# Patient Record
Sex: Female | Born: 1995 | Race: Black or African American | Hispanic: No | Marital: Single | Smoking: Never smoker
Health system: Southern US, Community
[De-identification: ages and names within clinical notes are randomized; demographics above are authoritative.]

## PROBLEM LIST (undated history)

## (undated) DIAGNOSIS — K921 Melena: Secondary | ICD-10-CM

## (undated) HISTORY — DX: Melena: K92.1

---
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
## 2014-02-03 ENCOUNTER — Ambulatory Visit: Payer: Medicaid Other

## 2014-02-10 ENCOUNTER — Ambulatory Visit: Payer: Medicaid Other | Admitting: Family Medicine

## 2014-06-28 ENCOUNTER — Encounter: Payer: Self-pay | Admitting: Family Medicine

## 2014-06-28 ENCOUNTER — Ambulatory Visit (INDEPENDENT_AMBULATORY_CARE_PROVIDER_SITE_OTHER): Payer: Federal, State, Local not specified - PPO | Admitting: Family Medicine

## 2014-06-28 VITALS — BP 117/77 | HR 67 | Temp 98.7°F | Ht 66.0 in | Wt 124.0 lb

## 2014-06-28 DIAGNOSIS — Z3042 Encounter for surveillance of injectable contraceptive: Secondary | ICD-10-CM

## 2014-06-28 LAB — POCT URINE PREGNANCY: PREG TEST UR: NEGATIVE

## 2014-06-28 MED ORDER — MEDROXYPROGESTERONE ACETATE 150 MG/ML IM SUSP
150.0000 mg | Freq: Once | INTRAMUSCULAR | Status: AC
  Administered 2014-06-28: 150 mg via INTRAMUSCULAR

## 2014-06-28 NOTE — Progress Notes (Signed)
Patient ID: Christy Molina, female   DOB: [birth year], 18 y.o.   MRN: [id number]   [facility] [name] P[place]oua Schreiner, MD Phone: [phone]  Subjective:  Christy Molina is a 18 y.o F who presents to discuss contraception. Per report pt missed depo shot about 4 months ago and had subsequent nml periods following. LMP: 06/20/2014 ; nml in length and quantity, Previously had excessive bleeding on depo, requiring provera. Interested in restarting depo X1 month to give herself time to consider other options. Has thought abt nexplanon.   All relevant systems were reviewed and were negative unless otherwise noted in the HPI  Past Medical History Reviewed problem list.  Medications- reviewed and updated Current Outpatient Prescriptions  Medication Sig Dispense Refill  . cetirizine (ZYRTEC) 10 MG tablet Take 10 mg by mouth daily.    Marland Kitchen. docusate sodium (COLACE) 100 MG capsule Take 1 capsule (100 mg total) by mouth 2 (two) times daily. 60 capsule 1  . EPINEPHrine (EPIPEN 2-PAK) 0.3 mg/0.3 mL SOAJ injection Inject 0.3 mLs (0.3 mg total) into the muscle once. As needed for anaphylaxis 1 Device 1  . ferrous sulfate 325 (65 FE) MG tablet Take 1 tablet (325 mg total) by mouth daily with breakfast. Let's recheck iron levels in 6 months. 30 tablet 5  . medroxyPROGESTERone (PROVERA) 5 MG tablet Take 1 tablet (5 mg total) by mouth daily. 5 tablet 0  . norgestimate-ethinyl estradiol (ORTHO-CYCLEN,SPRINTEC,PREVIFEM) 0.25-35 MG-MCG tablet Take 2 tablets by mouth daily. Take for 10 days then stop for at least 2 weeks before taking again. 20 tablet 2  . polyethylene glycol powder (GLYCOLAX/MIRALAX) powder Take 17 g by mouth 2 (two) times daily as needed. To have bowel movement at least every other day 3350 g 1   No current facility-administered medications for this visit.   Chief complaint-noted No additions to family history Social history- patient is a never smoker  Objective: BP 117/77 mmHg   Pulse 67  Temp(Src) 98.7 F (37.1 C) (Oral)  Ht 5\' 6"  (1.676 m)  Wt 124 lb (56.246 kg)  BMI 20.02 kg/m2  LMP 06/21/2014 (Approximate) Gen: NAD, alert, cooperative with exam Neuro: Alert and oriented, No gross deficits Skin: no rashes no lesions  Assessment/Plan: 1. Encounter for surveillance of injectable contraceptive - POCT urine pregnancy- negative -counseled extensively regarding options -chooses depo X1 month -followed by nexplanon at next visit  -will schedule f/up at that time   [name] Matthewjames Petrasek, MD Family Medicine PGY-2

## 2014-09-15 ENCOUNTER — Ambulatory Visit: Payer: Federal, State, Local not specified - PPO | Admitting: Family Medicine

## 2014-09-17 ENCOUNTER — Ambulatory Visit (INDEPENDENT_AMBULATORY_CARE_PROVIDER_SITE_OTHER): Payer: Federal, State, Local not specified - PPO | Admitting: *Deleted

## 2014-09-17 DIAGNOSIS — Z3042 Encounter for surveillance of injectable contraceptive: Secondary | ICD-10-CM

## 2014-09-17 MED ORDER — MEDROXYPROGESTERONE ACETATE 150 MG/ML IM SUSP
150.0000 mg | Freq: Once | INTRAMUSCULAR | Status: AC
  Administered 2014-09-17: 150 mg via INTRAMUSCULAR

## 2014-09-17 NOTE — Progress Notes (Signed)
  Pt in for Depo Provera injection.  Pt tolerated Depo injection. Depo given Left upper outer quadrant.  Next injection due May 14-Dec 18, 2014.  Reminder card given. Clovis PuMartin, Tamika L, RN

## 2014-12-13 ENCOUNTER — Ambulatory Visit (INDEPENDENT_AMBULATORY_CARE_PROVIDER_SITE_OTHER): Payer: Federal, State, Local not specified - PPO | Admitting: *Deleted

## 2014-12-13 DIAGNOSIS — Z3042 Encounter for surveillance of injectable contraceptive: Secondary | ICD-10-CM | POA: Diagnosis not present

## 2014-12-13 MED ORDER — MEDROXYPROGESTERONE ACETATE 150 MG/ML IM SUSP
150.0000 mg | Freq: Once | INTRAMUSCULAR | Status: AC
  Administered 2014-12-13: 150 mg via INTRAMUSCULAR

## 2014-12-13 NOTE — Progress Notes (Signed)
   Pt in for Depo Provera injection.  Pt tolerated Depo injection. Depo given right upper outer quadrant.  Next injection due August 8-February 22, 2015.  Reminder card given. Clovis PuMartin, Jamillah Camilo L, RN

## 2015-02-07 ENCOUNTER — Encounter: Payer: Self-pay | Admitting: Internal Medicine

## 2015-02-07 ENCOUNTER — Ambulatory Visit (INDEPENDENT_AMBULATORY_CARE_PROVIDER_SITE_OTHER): Payer: Federal, State, Local not specified - PPO | Admitting: Internal Medicine

## 2015-02-07 VITALS — BP 115/81 | HR 129 | Temp 99.7°F | Ht 66.0 in | Wt 125.8 lb

## 2015-02-07 DIAGNOSIS — N3090 Cystitis, unspecified without hematuria: Secondary | ICD-10-CM | POA: Diagnosis not present

## 2015-02-07 LAB — POCT URINALYSIS DIPSTICK
BILIRUBIN UA: NEGATIVE
Glucose, UA: NEGATIVE
Ketones, UA: NEGATIVE
Nitrite, UA: NEGATIVE
Spec Grav, UA: 1.025
Urobilinogen, UA: 2
pH, UA: 6.5

## 2015-02-07 LAB — POCT UA - MICROSCOPIC ONLY

## 2015-02-07 MED ORDER — CEPHALEXIN 500 MG PO CAPS: 500.0000 mg | ORAL_CAPSULE | Freq: Two times a day (BID) | ORAL | Status: DC

## 2015-02-07 NOTE — Patient Instructions (Signed)
Thanks for coming in. You were seen for UTI. I sent a prescription for Keflex  PO BID for 7 days. Take 1 pill twice a day for 7 days. Please return to clinic if symptoms worsen.

## 2015-02-07 NOTE — Progress Notes (Signed)
Patient ID: Christy ModenaPrecious N Molina, female   DOB: 31-Jul-1995, 19 y.o.   MRN: [id number]  Subjective:   CC: Patient thinks she may have UTI  HPI:   Notes of tingling sensation at the end of urination and increased frequency of urination. Symptoms lasted for 1 week and resolved 1 week ago. Denies blood in urine, abdominal pain/back pain, vaginal discharge, fevers, chills. No prior history of similar symptoms. Has been drinking more water and cranberry juice. Otherwise no other concerns today.   Review of Systems - Per HPI. Additionally, notes of headache.   PMH reviewed     Objective:  Physical Exam BP 115/81 mmHg  Pulse 129  Temp(Src) 99.7 F (37.6 C) (Oral)  Ht 5\' 6"  (1.676 m)  Wt 125 lb 12.8 oz (57.063 kg)  BMI 20.31 kg/m2  LMP  (LMP Unknown) GEN: NAD CV: RRR, no murmurs, rubs, or gallops PULM: CTAB, normal effort ABD: Soft, nontender, nondistended, NABS, no organomegaly; no suprapubic tenderness SKIN: No rash or cyanosis; warm and well-perfused Musculoskeletal: no CVA tenderness bilaterally.  NEURO: Awake, alert, no focal deficits grossly, normal speech    Assessment:     Christy Molina is a 19 y.o. female here for possible symptoms of UTI. Patient reports symptoms resolved 1 week ago. Currently asymptomatic    Plan:    Dysuria: Notes of tingling sensation at end of urination and increase frequency of urination both of which resolved 1 week ago; Currently asymptomatic. UA dipstick revealed trace leukocytes and trace intact blood. Microscopy revealed 2+ rods, 5-10 WBC, 0-3 epithelial cells. Consistent with Cystitis.  - Keflex 500mg  PO BID for 7 days  - return precautions discussed with patient   Follow-up: PRN   Palma HolterKanishka G Gunadasa, MD PGY1 [facility]

## 2015-02-10 LAB — URINE CULTURE: Colony Count: >=100000

## 2015-02-28 ENCOUNTER — Ambulatory Visit (INDEPENDENT_AMBULATORY_CARE_PROVIDER_SITE_OTHER): Payer: Federal, State, Local not specified - PPO | Admitting: *Deleted

## 2015-02-28 DIAGNOSIS — Z3042 Encounter for surveillance of injectable contraceptive: Secondary | ICD-10-CM

## 2015-02-28 MED ORDER — MEDROXYPROGESTERONE ACETATE 150 MG/ML IM SUSP
150.0000 mg | Freq: Once | INTRAMUSCULAR | Status: AC
  Administered 2015-02-28: 150 mg via INTRAMUSCULAR

## 2015-02-28 NOTE — Progress Notes (Signed)
   Pt in for Depo Provera injection.  Pt tolerated Depo injection. Depo given left upper outer quadrant.  Next injection due Oct 24-Nov. 7, 2016.  Reminder card given. Clovis Pu, RN

## 2015-05-16 ENCOUNTER — Ambulatory Visit: Payer: Federal, State, Local not specified - PPO

## 2015-05-17 ENCOUNTER — Ambulatory Visit (INDEPENDENT_AMBULATORY_CARE_PROVIDER_SITE_OTHER): Payer: Federal, State, Local not specified - PPO | Admitting: *Deleted

## 2015-05-17 DIAGNOSIS — Z3042 Encounter for surveillance of injectable contraceptive: Secondary | ICD-10-CM

## 2015-05-17 MED ORDER — MEDROXYPROGESTERONE ACETATE 150 MG/ML IM SUSP
150.0000 mg | Freq: Once | INTRAMUSCULAR | Status: AC
  Administered 2015-05-17: 150 mg via INTRAMUSCULAR

## 2015-05-17 NOTE — Progress Notes (Signed)
   Pt in for Depo Provera injection.  Pt tolerated Depo injection. Depo given right upper outer quadrant.  Next injection due Jan. 10-Jan. 24, 2017.  Reminder card given. Clovis PuMartin, Tamika L, RN

## 2015-07-29 ENCOUNTER — Ambulatory Visit (INDEPENDENT_AMBULATORY_CARE_PROVIDER_SITE_OTHER): Payer: Federal, State, Local not specified - PPO | Admitting: Family Medicine

## 2015-07-29 ENCOUNTER — Other Ambulatory Visit (HOSPITAL_COMMUNITY)
Admission: RE | Admit: 2015-07-29 | Discharge: 2015-07-29 | Disposition: A | Discharge status: Home / Self Care | Payer: Federal, State, Local not specified - PPO | Source: Ambulatory Visit | Attending: Family Medicine | Admitting: Family Medicine

## 2015-07-29 ENCOUNTER — Encounter: Payer: Self-pay | Admitting: Family Medicine

## 2015-07-29 VITALS — BP 104/61 | HR 86 | Temp 98.3°F | Ht 66.0 in | Wt 128.5 lb

## 2015-07-29 DIAGNOSIS — Z113 Encounter for screening for infections with a predominantly sexual mode of transmission: Secondary | ICD-10-CM | POA: Insufficient documentation

## 2015-07-29 DIAGNOSIS — N898 Other specified noninflammatory disorders of vagina: Secondary | ICD-10-CM | POA: Diagnosis not present

## 2015-07-29 DIAGNOSIS — Z3042 Encounter for surveillance of injectable contraceptive: Secondary | ICD-10-CM

## 2015-07-29 LAB — POCT WET PREP (WET MOUNT): Clue Cells Wet Prep Whiff POC: NEGATIVE

## 2015-07-29 MED ORDER — MEDROXYPROGESTERONE ACETATE 150 MG/ML IM SUSP
150.0000 mg | Freq: Once | INTRAMUSCULAR | Status: AC
  Administered 2015-07-29: 150 mg via INTRAMUSCULAR

## 2015-07-29 NOTE — Patient Instructions (Signed)
Thank you so much for coming to visit today! We obtained a lab today for your discharge and you will be contacted with the results. I will let you know if a medication needs to be sent in depending on the results.   Thanks again! Dr. Caroleen Hammanumley

## 2015-07-30 DIAGNOSIS — N898 Other specified noninflammatory disorders of vagina: Secondary | ICD-10-CM | POA: Insufficient documentation

## 2015-07-30 NOTE — Progress Notes (Signed)
Subjective:     Patient ID: Christy Molina, female   DOB: 1996/06/13, 20 y.o.   MRN: [id number]  HPI Christy Molina is a [place]19yo female presenting today for vaginal discharge. - Discharge white - Started three weeks ago, but seems to be getting better - Notes genital sore one week ago, resolved - Sexually active. Currently on Depo for birth control. Uses condoms. - Denies abdominal pain, abnormal vaginal bleeding, fever  Review of Systems Per HPI. Otherwise negative.    Objective:   Physical Exam  Constitutional: She appears well-developed and well-nourished. No distress.  Cardiovascular: Normal rate.  Exam reveals no gallop and no friction rub.   No murmur heard. Pulmonary/Chest: Effort normal. No respiratory distress. She has no wheezes.  Genitourinary:  No discharge noted. Hyperpigmented lesion on vulva, but no open sore or ulcer.      Assessment and Plan:     Vaginal discharge - Wet prep negative - Gonorrhea/Chlamydia pending - Consider HSV, HIV, and RPR if no improvement or further symptoms occur - Follow up if no improvement

## 2015-07-30 NOTE — Assessment & Plan Note (Signed)
-   Wet prep negative - Gonorrhea/Chlamydia pending - Consider HSV, HIV, and RPR if no improvement or further symptoms occur - Follow up if no improvement

## 2015-08-01 LAB — CERVICOVAGINAL ANCILLARY ONLY
CHLAMYDIA, DNA PROBE: NEGATIVE
NEISSERIA GONORRHEA: NEGATIVE

## 2015-10-06 IMAGING — US US PELVIS COMPLETE
1 series · 14 of 25 positions shown · non-contrast
Comparison: None.

CLINICAL DATA: Menorrhagia

EXAM:
TRANSABDOMINAL ULTRASOUND OF PELVIS
TECHNIQUE: Transabdominal ultrasound examination of the pelvis was performed
including evaluation of the uterus, ovaries, adnexal regions, and
pelvic cul-de-sac.

[Series 1: us pelvis complete · 14 of 28 slices shown]
[im 1/28]
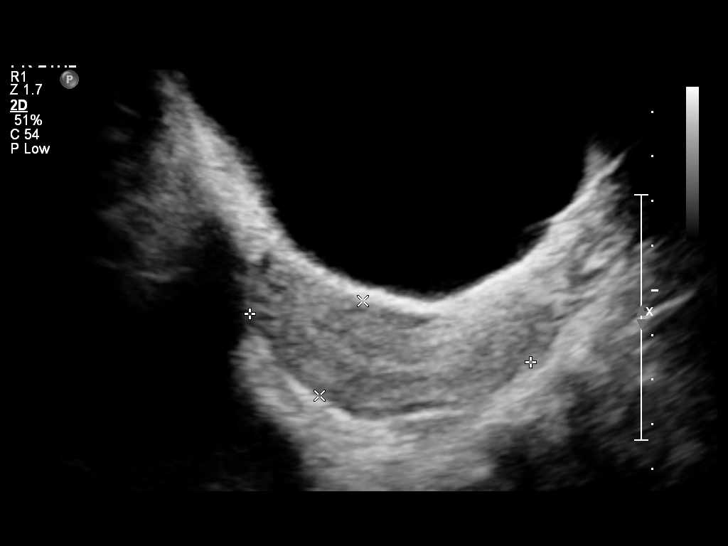
[im 3/28]
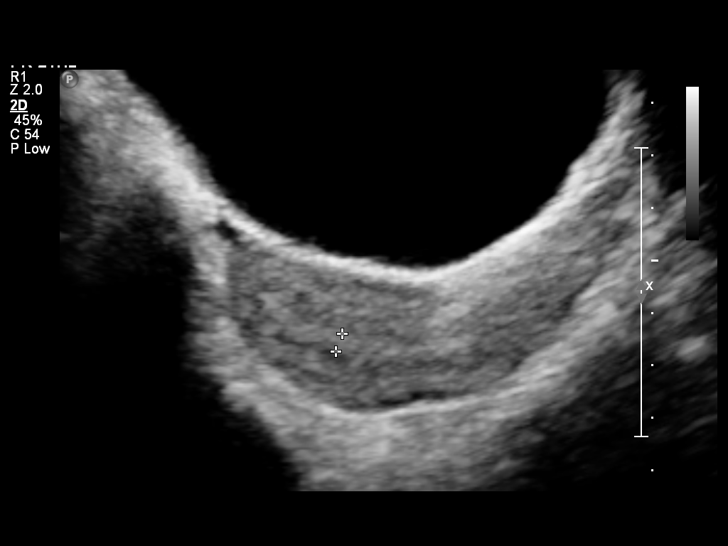
[im 5/28]
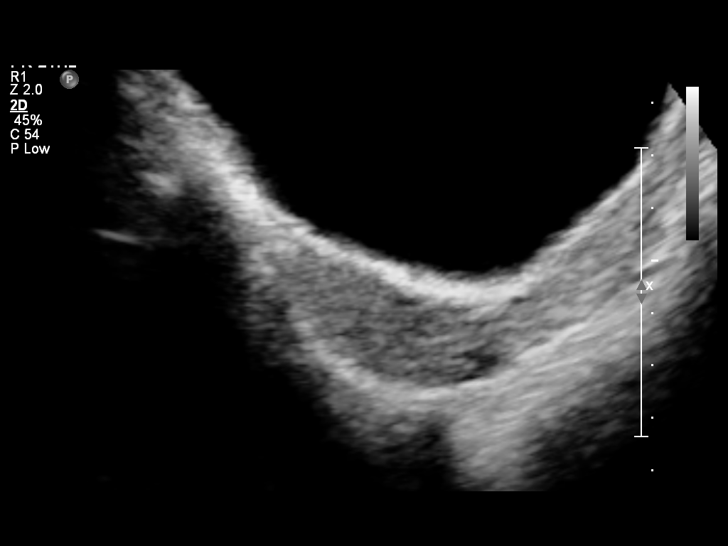
[im 7/28]
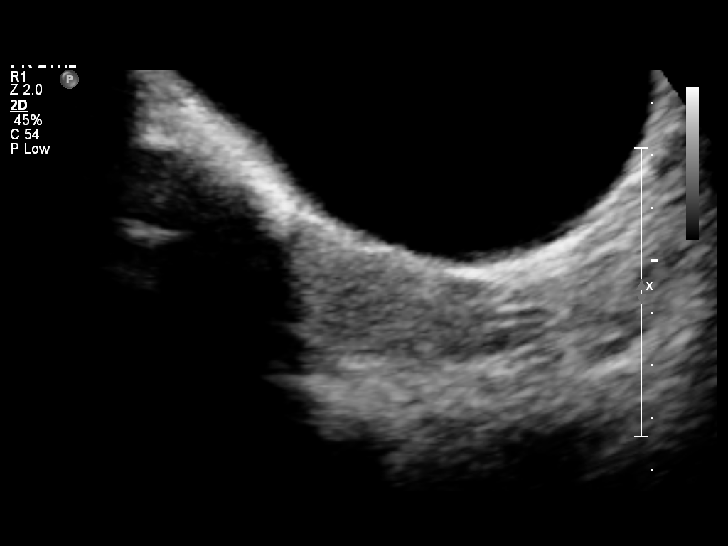
[im 10/28]
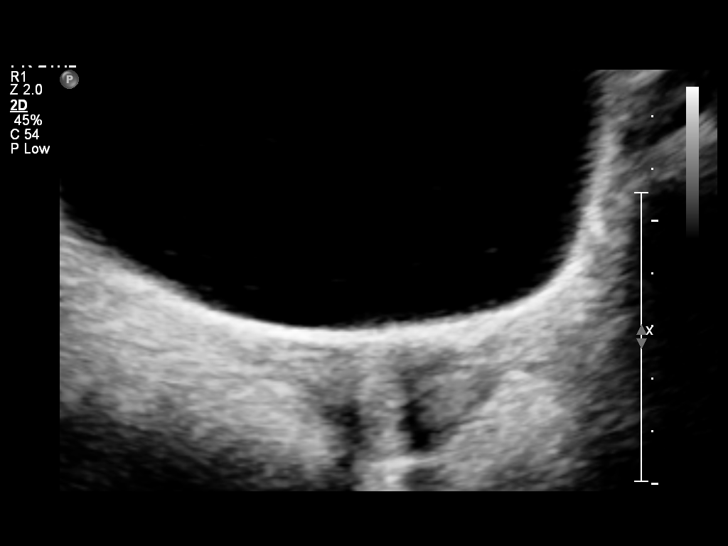
[im 11/28]
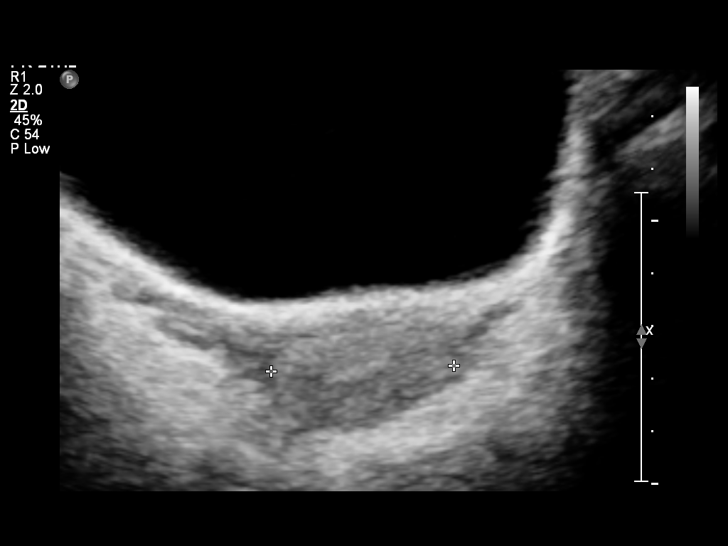
[im 13/28]
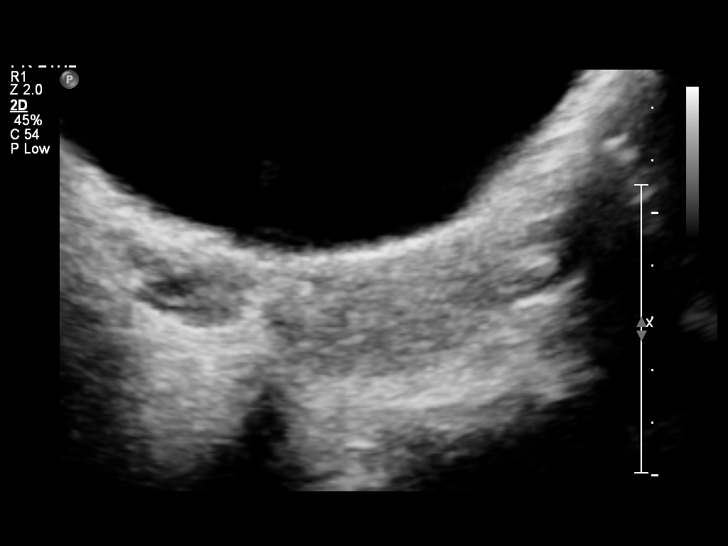
[im 15/28]
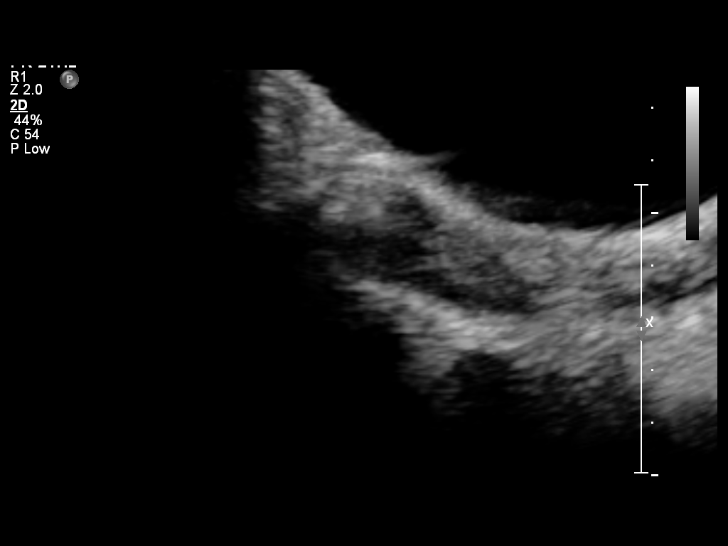
[im 17/28]
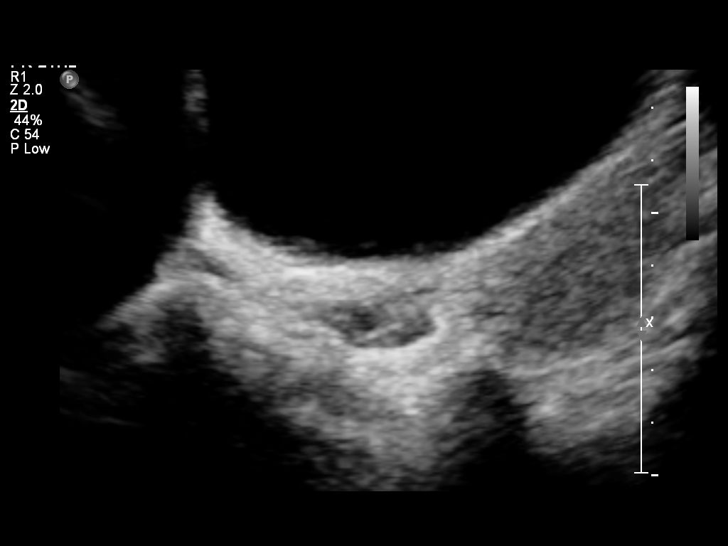
[im 19/28]
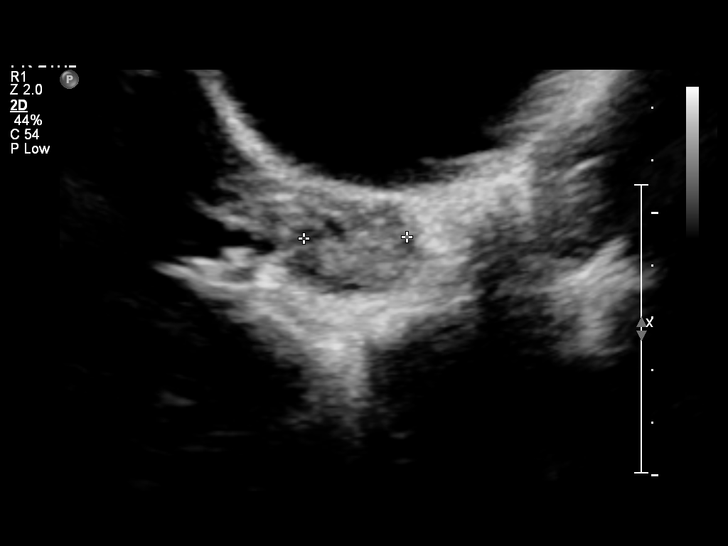
[im 21/28]
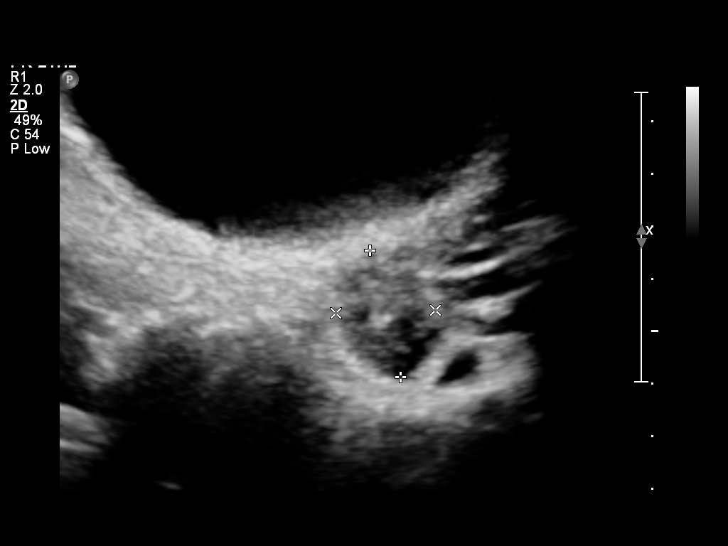
[im 23/28]
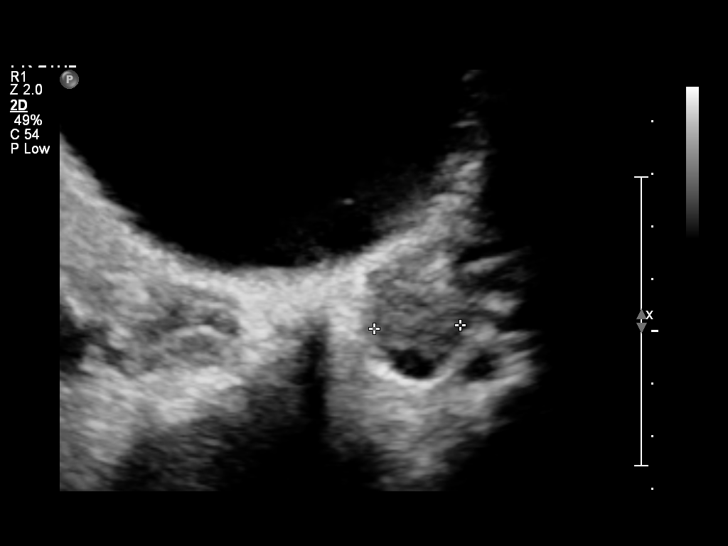
[im 25/28]
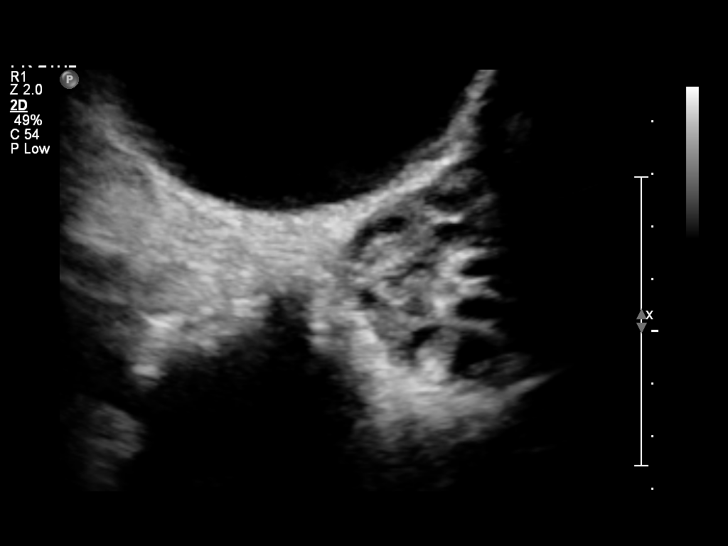
[im 28/28]
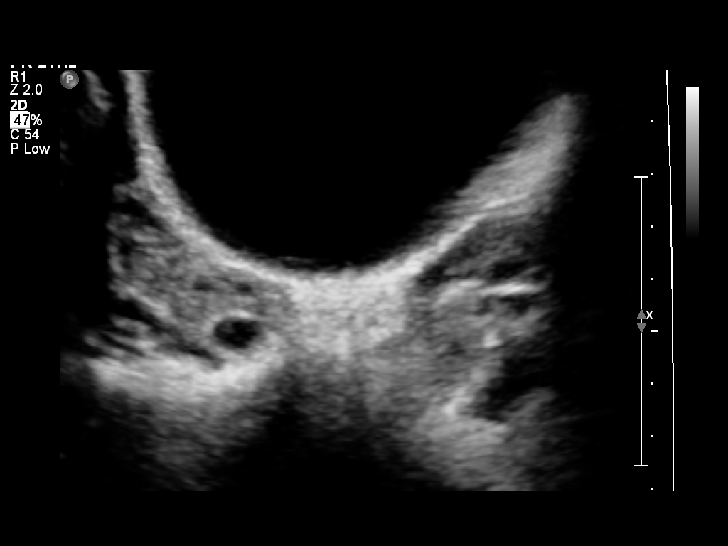

[14 of 25 positions shown; findings below may reference images not displayed]

FINDINGS: Uterus

Measurements: 6.4 x 2.3 x 3.5 cm. No fibroids or other mass
visualized.

Endometrium

Thickness: 4 mm.  No focal abnormality visualized.

Right ovary

Measurements: 4.0 x 1.4 x 2.0 cm. Normal appearance/no adnexal mass.

Left ovary

Measurements: 2.5 x 1.9 x 1.6 cm. Normal appearance/no adnexal mass.

Other findings:  No free fluid
IMPRESSION: Unremarkable transabdominal pelvic ultrasound.

## 2015-10-14 ENCOUNTER — Ambulatory Visit: Payer: Federal, State, Local not specified - PPO

## 2015-10-17 ENCOUNTER — Ambulatory Visit: Payer: Federal, State, Local not specified - PPO

## 2016-08-22 ENCOUNTER — Ambulatory Visit: Payer: Managed Care, Other (non HMO) | Admitting: Family Medicine

## 2016-08-23 ENCOUNTER — Ambulatory Visit: Payer: Federal, State, Local not specified - PPO | Admitting: Internal Medicine

## 2016-12-20 NOTE — Progress Notes (Signed)
Christy Molina is a 21 y.o. female here for to Establish Care and discuss Amenorrhea.  I acted as a Neurosurgeon for [employer], PA-C Christy Mull, LPN  History of Present Illness:   Chief Complaint  Patient presents with  . Establish Care    [employer]   . Amenorrhea    Last period was 08/2015, light flow x 4 days, spotting only in 04/2016  . pelvic cramps and low back pain    Acute Concerns: Amenorrhea -- Patient reports that she has a long history of abnormal periods. She states that she was put on Depo-Provera for approximately 4 years and most recently had her last injection January 2017. She discontinued Depo-Provera to take a break from it. Since January 2017 she has had one period which was in February 2017 followed by one additional episode of light spotting in October 2017. She does get cramps every month as well as back pain but does not have any vaginal bleeding. She states that prior to being on Depo-Provera she had very irregular periods. She states that prior to going on Depo-Provera, she had a period that lasted 6 months. In 2015 she had a pelvic abdominal ultrasound for menorrhagia that was unremarkable. Was also given Provera 5 mg x 5 days in May 2015. Per chart review in 2013, patient had some workup for possible PCOS from her primary care provider in family medicine. She had low Hgb secondary to heavy bleeding and a moderately elevated testosterone and she was not diagnosed with PCOS at that time. She reports that she does not have any excessive hair growth, or family history of PCOS. She does report that her mom has an ovarian cyst as well as type 2 diabetes. She is currently sexually active, and has one partner, denies any concerns for sexually transmitted infections. Denies any history of sexually transmitted infections. She states that she has had one Pap smear and this was at age 29 and from what she can remember was normal. She has never been pregnant. She does report that  she is currently at her highest weight that she has ever been 139 pounds. Prior to starting Depo-Provera she was 125 pounds. She takes Midol for her abdominal cramping during her monthly cramping however does not help. She denies any bleeding with or after sex or pain with sex.  Depo-Provera: Nov 2013 - Jan 2017  Menarche: age 81 or 81  Chronic Issues: None  Health Maintenance: Weight -- Weight: 139 lb (63 kg)   Depression screen PHQ 2/9 12/21/2016  Decreased Interest 0  Down, Depressed, Hopeless 0  PHQ - 2 Score 0    Other providers/specialists: Never seen an Ob-Gyn  PMHx, SurgHx, SocialHx, Medications, and Allergies were reviewed in the Visit Navigator and updated as appropriate.  Current Medications:   Current Outpatient Prescriptions:  .  cetirizine (ZYRTEC) 10 MG tablet, Take 10 mg by mouth daily., Disp: , Rfl:  .  EPINEPHrine (EPIPEN 2-PAK) 0.3 mg/0.3 mL SOAJ injection, Inject 0.3 mLs (0.3 mg total) into the muscle once. As needed for anaphylaxis (Patient not taking: Reported on 12/21/2016), Disp: 1 Device, Rfl: 1 .  ferrous sulfate 325 (65 FE) MG tablet, Take 1 tablet (325 mg total) by mouth daily with breakfast. Let's recheck iron levels in 6 months. (Patient not taking: Reported on 12/21/2016), Disp: 30 tablet, Rfl: 5   Review of Systems:   Review of Systems  Constitutional: Negative for chills, fever, malaise/fatigue and weight loss.  HENT: Negative for  hearing loss, sinus pain and sore throat.   Eyes: Negative for blurred vision.  Respiratory: Negative for cough and shortness of breath.   Cardiovascular: Negative for chest pain, palpitations and leg swelling.  Gastrointestinal: Negative for abdominal pain, constipation, diarrhea, heartburn, nausea and vomiting.  Genitourinary: Negative for dysuria, frequency and urgency.  Musculoskeletal: Positive for back pain and neck pain. Negative for myalgias.       Low back pain and pelvic cramps off and on  Skin: Negative for  itching and rash.  Neurological: Positive for headaches. Negative for dizziness, tingling, seizures and loss of consciousness.  Endo/Heme/Allergies: Negative for polydipsia.  Psychiatric/Behavioral: Negative for depression. The patient is not nervous/anxious.     Vitals:   Vitals:   12/21/16 0845  BP: 110/74  Pulse: 84  Temp: 99.1 F (37.3 C)  TempSrc: Oral  SpO2: 99%  Weight: 139 lb (63 kg)  Height: 5\' 6"  (1.676 m)     Body mass index is 22.44 kg/m.  Physical Exam:   Physical Exam  Constitutional: She appears well-developed. She is cooperative.  Non-toxic appearance. She does not have a sickly appearance. She does not appear ill. No distress.  Cardiovascular: Normal rate, regular rhythm, S1 normal, S2 normal, normal heart sounds and normal pulses.   No LE edema  Pulmonary/Chest: Effort normal and breath sounds normal.  Abdominal: Normal appearance and bowel sounds are normal. There is tenderness in the left lower quadrant. There is no rigidity, no rebound and no guarding.  Genitourinary: Vagina normal. There is no rash, tenderness or lesion on the right labia. There is no rash, tenderness or lesion on the left labia. Cervix exhibits no friability.  Genitourinary Comments: Leukorrhea present, no odor. Patient very nervous throughout exam. I did not complete a bimanual exam. Pap obtained.  Neurological: She is alert.  Nursing note and vitals reviewed.     Assessment and Plan:    Christy Molina was seen today for establish care, amenorrhea and pelvic cramps and low back pain.  Diagnoses and all orders for this visit:  Amenorrhea Patient has had extensive workup in the past for family medicine without any findings. Given extensive ongoing issues, we will refer her to OB/GYN. I have obtained some baseline labs today including CBC, CMP, TSH, Pap with cytology. Urine pregnancy was negative today. I advised patient to continue to use condoms for birth control and to help prevent STI's  until further evaluated by OB/GYN to discuss further options for birth control. -     POCT urine pregnancy -     Cytology - PAP -     CBC with Differential/Platelet -     Comprehensive metabolic panel -     TSH -     Ambulatory referral to Obstetrics / Gynecology  Vaginal discharge I have ordered Pap as well as cytology to screen for BV, yeast, STI's. We will call her with these results. -     Cytology - PAP  Encounter for screening for HIV She is agreeable to one-time HIV screening. -     HIV antibody  . Reviewed expectations re: course of current medical issues. . Discussed self-management of symptoms. . Outlined signs and symptoms indicating need for more acute intervention. . Patient verbalized understanding and all questions were answered. . See orders for this visit as documented in the electronic medical record. . Patient received an After-Visit Summary.  CMA or LPN served as scribe during this visit. History, Physical, and Plan performed by medical provider. Documentation  and orders reviewed and attested to.  Jarold MottoSamantha Goldie Tregoning, PA-C

## 2016-12-21 ENCOUNTER — Encounter: Payer: Self-pay | Admitting: Physician Assistant

## 2016-12-21 ENCOUNTER — Ambulatory Visit (INDEPENDENT_AMBULATORY_CARE_PROVIDER_SITE_OTHER): Payer: Managed Care, Other (non HMO) | Admitting: Physician Assistant

## 2016-12-21 ENCOUNTER — Other Ambulatory Visit (HOSPITAL_COMMUNITY)
Admission: RE | Admit: 2016-12-21 | Discharge: 2016-12-21 | Disposition: A | Discharge status: Home / Self Care | Payer: Managed Care, Other (non HMO) | Source: Ambulatory Visit | Attending: Family Medicine | Admitting: Family Medicine

## 2016-12-21 VITALS — BP 110/74 | HR 84 | Temp 99.1°F | Ht 66.0 in | Wt 139.0 lb

## 2016-12-21 DIAGNOSIS — Z114 Encounter for screening for human immunodeficiency virus [HIV]: Secondary | ICD-10-CM | POA: Diagnosis not present

## 2016-12-21 DIAGNOSIS — N898 Other specified noninflammatory disorders of vagina: Secondary | ICD-10-CM

## 2016-12-21 DIAGNOSIS — N912 Amenorrhea, unspecified: Secondary | ICD-10-CM | POA: Insufficient documentation

## 2016-12-21 LAB — COMPREHENSIVE METABOLIC PANEL
ALBUMIN: 4.6 g/dL (ref 3.5–5.2)
ALK PHOS: 43 U/L (ref 39–117)
ALT: 6 U/L (ref 0–35)
AST: 16 U/L (ref 0–37)
BUN: 8 mg/dL (ref 6–23)
CALCIUM: 9.7 mg/dL (ref 8.4–10.5)
CO2: 28 mEq/L (ref 19–32)
CREATININE: 0.79 mg/dL (ref 0.40–1.20)
Chloride: 104 mEq/L (ref 96–112)
GFR: 117.55 mL/min (ref >60.00)
Glucose, Bld: 82 mg/dL (ref 70–99)
Potassium: 3.6 mEq/L (ref 3.5–5.1)
Sodium: 138 mEq/L (ref 135–145)
TOTAL PROTEIN: 7.2 g/dL (ref 6.0–8.3)
Total Bilirubin: 1.5 mg/dL — ABNORMAL HIGH (ref 0.2–1.2)

## 2016-12-21 LAB — CBC WITH DIFFERENTIAL/PLATELET
BASOS PCT: 0.7 % (ref 0.0–3.0)
Basophils Absolute: 0 10*3/uL (ref 0.0–0.1)
EOS ABS: 0.1 10*3/uL (ref 0.0–0.7)
Eosinophils Relative: 1.8 % (ref 0.0–5.0)
HEMATOCRIT: 41.7 % (ref 36.0–46.0)
HEMOGLOBIN: 14.1 g/dL (ref 12.0–15.0)
LYMPHS PCT: 37.7 % (ref 12.0–46.0)
Lymphs Abs: 2 10*3/uL (ref 0.7–4.0)
MCHC: 33.9 g/dL (ref 30.0–36.0)
MCV: 90.7 fl (ref 78.0–100.0)
Monocytes Absolute: 0.4 10*3/uL (ref 0.1–1.0)
Monocytes Relative: 6.8 % (ref 3.0–12.0)
Neutro Abs: 2.8 10*3/uL (ref 1.4–7.7)
Neutrophils Relative %: 53 % (ref 43.0–77.0)
Platelets: 198 10*3/uL (ref 150.0–400.0)
RBC: 4.6 Mil/uL (ref 3.87–5.11)
RDW: 13.6 % (ref 11.5–15.5)
WBC: 5.3 10*3/uL (ref 4.0–10.5)

## 2016-12-21 LAB — TSH: TSH: 1.44 u[IU]/mL (ref 0.35–4.50)

## 2016-12-21 LAB — POCT URINE PREGNANCY: PREG TEST UR: NEGATIVE

## 2016-12-21 NOTE — Patient Instructions (Signed)
It was great meeting you today!  We will call you with your lab results.  I am going to refer to a gynecologist for further work-up for your symptoms.  Primary Amenorrhea Primary amenorrhea is the absence of any menstrual flow in a female by the age of 15 years. An average age for the start of menstruation is the age of 12 years. Primary amenorrhea is not considered to have occurred until a female is older than 15 years and has never menstruated. This may occur with or without other signs of puberty. What are the causes? Some common causes of not menstruating include:  Chromosomal abnormality causing the ovaries to malfunction is the most common cause of primary amenorrhea.  Malnutrition.  Low blood sugar (hypoglycemia).  Polycystic ovary syndrome (cysts in the ovaries, not ovulating).  Absence of the vagina, uterus, or ovaries since birth (congenital).  Extreme obesity.  Cystic fibrosis.  Drastic weight loss from any cause.  Over-exercising (running, biking) causing loss of body fat.  Pituitary gland tumor in the brain.  Long-term (chronic) illnesses.  Cushing disease.  Thyroid disease (hypothyroidism, hyperthyroidism).  Part of the brain (hypothalamus) not functioning normally.  Premature ovarian failure.  What are the signs or symptoms? No menstruation by age 22 years in normally developed females is the primary symptom. Other symptoms may include:  Discharge from the breasts.  Hot flashes.  Adult acne.  Facial or chest hair.  Headaches.  Impaired vision.  Recent stress.  Changes in weight, diet, or exercise patterns.  How is this diagnosed? Primary amenorrhea is diagnosed with the help of a medical history and a physical exam. Other tests that may be recommended include:  Blood tests to check for pregnancy, hormonal changes, a bleeding or thyroid disorder, low iron levels (anemia), or other problems.  Urine tests.  Specialized X-ray exams.  How  is this treated? Treatment will depend on the cause. For example, some of the causes of primary amenorrhea, such as congenital absence of sex organs, will require surgery to correct. Others may respond to treatment with medicine. Contact a health care provider if:  There has not been any menstrual flow by age 22 years.  Body maturation does not occur at a level typical of peers.  Pelvic area pain occurs.  There is unusual weight gain or hair growth. This information is not intended to replace advice given to you by your health care provider. Make sure you discuss any questions you have with your health care provider. Document Released: 07/09/2005 Document Revised: 12/15/2015 Document Reviewed: 02/18/2013 Elsevier Interactive Patient Education  2018 [employer]

## 2016-12-22 LAB — HIV ANTIBODY (ROUTINE TESTING W REFLEX): HIV: NONREACTIVE

## 2016-12-25 LAB — CYTOLOGY - PAP
Bacterial vaginitis: NEGATIVE
Candida vaginitis: NEGATIVE
Chlamydia: NEGATIVE
Diagnosis: NEGATIVE
Neisseria Gonorrhea: NEGATIVE
TRICH (WINDOWPATH): NEGATIVE

## 2016-12-27 ENCOUNTER — Ambulatory Visit (INDEPENDENT_AMBULATORY_CARE_PROVIDER_SITE_OTHER): Payer: Managed Care, Other (non HMO) | Admitting: Obstetrics & Gynecology

## 2016-12-27 ENCOUNTER — Encounter: Payer: Self-pay | Admitting: Obstetrics & Gynecology

## 2016-12-27 VITALS — BP 118/76 | Ht 65.0 in | Wt 140.0 lb

## 2016-12-27 DIAGNOSIS — Z3009 Encounter for other general counseling and advice on contraception: Secondary | ICD-10-CM | POA: Diagnosis not present

## 2016-12-27 DIAGNOSIS — N911 Secondary amenorrhea: Secondary | ICD-10-CM | POA: Diagnosis not present

## 2016-12-27 LAB — PREGNANCY, URINE: Preg Test, Ur: NEGATIVE

## 2016-12-27 MED ORDER — MEDROXYPROGESTERONE ACETATE 5 MG PO TABS: 5.0000 mg | ORAL_TABLET | Freq: Every day | ORAL | 0 refills | Status: DC

## 2016-12-27 NOTE — Patient Instructions (Signed)
1. Secondary amenorrhea Last DepoProvera 07/2015 which could have caused her Amenorrhea for up to a year.  Still no period x 5 additional months.  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  Labs so far wnl, will complete with Prolactin today.  Provera 5 mg PO qd x 7 days to bring a withdrawal bleeding.  Will then proceed with a Pelvic US and decide on treatment with all the investigation completed. - Prolactin - US Transvaginal Non-OB; Future  2. Encounter for other general counseling or advice on contraception Strict condom use.  Declines starting back on DepoProvera and not recommended.  Declines BCPs, worried about compliance.  Patch, Ring reviewed.  Nexplanon and Progestin IUDs Benefits/Risks/Insertion reviewed in details and pamphlets given.  Will probably opt for Mirena IUD, will confirm at f/u.   Christy Molina, it was a pleasure to meet you today!  I will inform you of your result as soon as available.  See you in 2 wks for the pelvic US and decide on treatment and contraception.

## 2016-12-27 NOTE — Progress Notes (Signed)
    Christy Molina April 24, 1996 [id number]        21 y.o.  G0 Boyfriend/Condoms.  Menarche 10-21 yo  RP:  Amenorrhea x more than a year  HPI:  Long history of irregular and heavy periods.  Started on Depo-Provera which she took for 4 years (05/2012 to 07/2015), last injection January 2017. Since then, had a period in February 2017, followed by one additional episode of light spotting in October 2017.  No vaginal bleeding since, but midpelvic cramps every month with back pain.  No pelvic pain otherwise.  Normal vaginal secretions.  Strict condom use.  Acne on chest mainly, facial acne is mild.  No increase hair in female distribution.  No H/O STI.  BMI 23.3.  Past medical history,surgical history, problem list, medications, allergies, family history and social history were all reviewed and documented in the EPIC chart.  Directed ROS with pertinent positives and negatives documented in the history of present illness/assessment and plan.  Exam:  Vitals:   12/27/16 1523  BP: 118/76  Weight: 140 lb (63.5 kg)  Height: 5\' 5"  (1.651 m)   General appearance:  Normal  Abdo:  Soft, not distended, no mass, NT. Gyn exam:  Just done with Pap normal.  Gono-Chlam neg.                       Will complete evaluation with a Pelvic US.  Labs 12/2016:  TSH 1.44 wnl, Glu 82 wnl.  CMP wnl except mild increase of Tot Bili.  CBC wnl.                         Pap, Gono-Chlam neg.  UPT neg. UPT neg today again.  Assessment/Plan:  21 y.o. G0  1. Secondary amenorrhea Last DepoProvera 07/2015 which could have caused her Amenorrhea for up to a year.  Still no period x 5 additional months.  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  Labs so far wnl, will complete with Prolactin today.  Provera 5 mg PO qd x 7 days to bring a withdrawal bleeding.  Will then proceed with a Pelvic US and decide on treatment with all the investigation completed. - Prolactin - US Transvaginal Non-OB; Future  2. Encounter for other general  counseling or advice on contraception Strict condom use.  Declines starting back on DepoProvera and not recommended.  Declines BCPs, worried about compliance.  Patch, Ring reviewed.  Nexplanon and Progestin IUDs Benefits/Risks/Insertion reviewed in details and pamphlets given.  Will probably opt for Mirena IUD, will confirm at f/u.  Counseling on above issues >50% x 30 minutes.  Genia DelMarie-Lyne Lumi Winslett MD, 3:40 PM 12/27/2016

## 2016-12-28 LAB — PROLACTIN: PROLACTIN: 19.9 ng/mL

## 2017-01-07 ENCOUNTER — Ambulatory Visit: Payer: Managed Care, Other (non HMO) | Admitting: Obstetrics & Gynecology

## 2017-01-07 ENCOUNTER — Other Ambulatory Visit: Payer: Managed Care, Other (non HMO)

## 2017-01-07 DIAGNOSIS — Z0289 Encounter for other administrative examinations: Secondary | ICD-10-CM

## 2017-09-30 ENCOUNTER — Encounter: Payer: Self-pay | Admitting: Physician Assistant

## 2017-09-30 ENCOUNTER — Ambulatory Visit (INDEPENDENT_AMBULATORY_CARE_PROVIDER_SITE_OTHER): Payer: 59 | Admitting: Physician Assistant

## 2017-09-30 VITALS — BP 118/80 | HR 89 | Temp 98.5°F | Ht 65.0 in | Wt 159.4 lb

## 2017-09-30 DIAGNOSIS — H1033 Unspecified acute conjunctivitis, bilateral: Secondary | ICD-10-CM | POA: Diagnosis not present

## 2017-09-30 MED ORDER — POLYMYXIN B-TRIMETHOPRIM 10000-0.1 UNIT/ML-% OP SOLN: 1.0000 [drp] | OPHTHALMIC | 0 refills | Status: DC

## 2017-09-30 NOTE — Patient Instructions (Addendum)
I have sent in eye drops for you to use.  If symptoms worsen or persist despite treatment, I recommend that you go to an eye doctor. If you develop severe pain or changes in vision, please go to the ER.

## 2017-09-30 NOTE — Progress Notes (Signed)
Nyriah N Benjiman CoreHinton is a 22 y.o. female here for a new problem.  I acted as a Neurosurgeonscribe for [employer] CorporationSamantha Chayim Bialas, PA-C Corky Mullonna Orphanos, LPN  History of Present Illness:   Chief Complaint  Patient presents with  . Eye Problem    Eye Problem   Both eyes are affected.This is a new problem. Episode onset: Started on Friday. The problem occurs constantly. The problem has been gradually worsening. There was no injury mechanism. The pain is at a severity of 8/10. The pain is moderate. There is no known exposure to pink eye. She does not wear contacts. Associated symptoms include blurred vision, an eye discharge, eye redness, itching and photophobia. Pertinent negatives include no fever, nausea, recent URI or vomiting. She has tried eye drops for the symptoms. The treatment provided no relief.   She has been using erythromycin ointment that her mom had, she thinks that this may have made her eyes worse -- she does not know if this is expired. She does not wear contacts. 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 She also reports that they got new carpet installed in her home last week.  She reports that the symptoms started in one eye and then transferred to the other eye.  Past Medical History:  Diagnosis Date  . Hematochezia      Social History   Socioeconomic History  . Marital status: Single    Spouse name: Not on file  . Number of children: Not on file  . Years of education: Not on file  . Highest education level: Not on file  Social Needs  . Financial resource strain: Not on file  . Food insecurity - worry: Not on file  . Food insecurity - inability: Not on file  . Transportation needs - medical: Not on file  . Transportation needs - non-medical: Not on file  Occupational History  . Not on file  Tobacco Use  . Smoking status: Never Smoker  . Smokeless tobacco: Never Used  Substance and Sexual Activity  . Alcohol use: Yes   Comment: one glass of wine occassionally  . Drug use: No  . Sexual activity: Yes    Birth control/protection: Condom  Other Topics Concern  . Not on file  Social History Narrative  . Not on file    Past Surgical History:  Procedure Laterality Date  . WISDOM TOOTH EXTRACTION Bilateral 2014    Family History  Problem Relation Age of Onset  . Diabetes Mother     Allergies  Allergen Reactions  . Food     Citrus fruits  . Peanut-Containing Drug Products     Current Medications:   Current Outpatient Medications:  .  cetirizine (ZYRTEC) 10 MG tablet, Take 10 mg by mouth daily., Disp: , Rfl:  .  trimethoprim-polymyxin b (POLYTRIM) ophthalmic solution, Place 1 drop into both eyes every 4 (four) hours., Disp: 10 mL, Rfl: 0   Review of Systems:   Review of Systems  Constitutional: Negative for fever.  Eyes: Positive for blurred vision, photophobia, discharge, redness and itching.  Gastrointestinal: Negative for nausea and vomiting.    Vitals:   Vitals:   09/30/17 0808  BP: 118/80  Pulse: 89  Temp: 98.5 F (36.9 C)  TempSrc: Oral  SpO2: 100%  Weight: 159 lb 6.1 oz (72.3 kg)  Height: 5\' 5"  (1.651 m)     Body mass index is 26.52 kg/m.  Physical Exam:  Physical Exam  Constitutional: She appears well-developed. She is cooperative.  Non-toxic appearance. She does not have a sickly appearance. She does not appear ill. No distress.  HENT:  Head: Normocephalic and atraumatic.  Right Ear: Tympanic membrane, external ear and ear canal normal. Tympanic membrane is not erythematous, not retracted and not bulging.  Left Ear: Tympanic membrane, external ear and ear canal normal. Tympanic membrane is not erythematous, not retracted and not bulging.  Nose: Nose normal. Right sinus exhibits no maxillary sinus tenderness and no frontal sinus tenderness. Left sinus exhibits no maxillary sinus tenderness and no frontal sinus tenderness.  Mouth/Throat: Uvula is midline. No posterior  oropharyngeal edema or posterior oropharyngeal erythema.  Eyes: EOM and lids are normal. Pupils are equal, round, and reactive to light. Right conjunctiva is injected. Left conjunctiva is injected.  Bilateral eyes with erythema and clear discharge. No evidence of purulent discharge or crusting. No pain elicited with EOM.  Neck: Trachea normal.  Cardiovascular: Normal rate, regular rhythm, S1 normal, S2 normal and normal heart sounds.  Pulmonary/Chest: Effort normal and breath sounds normal. She has no decreased breath sounds. She has no wheezes. She has no rhonchi. She has no rales.  Lymphadenopathy:    She has no cervical adenopathy.  Neurological: She is alert.  Skin: Skin is warm, dry and intact.  Psychiatric: She has a normal mood and affect. Her speech is normal and behavior is normal.  Nursing note and vitals reviewed.   Assessment and Plan:    Marthe was seen today for eye problem.  Diagnoses and all orders for this visit:  Acute conjunctivitis of both eyes, unspecified acute conjunctivitis type  Other orders -     trimethoprim-polymyxin b (POLYTRIM) ophthalmic solution; Place 1 drop into both eyes every 4 (four) hours.   No red flags on exam. Start polytrim solution. Practice good hand hygiene and avoid touching eye. I advised that if her symptoms worsen or do not improve, she should go to an eye doctor. Also, I advised that if she develops worsening pain or any changes in vision she she should go to the ER. Patient verbalized understanding.  . Reviewed expectations re: course of current medical issues. . Discussed self-management of symptoms. . Outlined signs and symptoms indicating need for more acute intervention. . Patient verbalized understanding and all questions were answered. . See orders for this visit as documented in the electronic medical record. . Patient received an After-Visit Summary.  CMA or LPN served as scribe during this visit. History, Physical, and  Plan performed by medical provider. Documentation and orders reviewed and attested to.  Jarold Motto, PA-C

## 2017-10-07 ENCOUNTER — Other Ambulatory Visit (HOSPITAL_COMMUNITY)
Admission: RE | Admit: 2017-10-07 | Discharge: 2017-10-07 | Disposition: A | Discharge status: Home / Self Care | Payer: 59 | Source: Ambulatory Visit | Attending: Physician Assistant | Admitting: Physician Assistant

## 2017-10-07 ENCOUNTER — Encounter: Payer: Self-pay | Admitting: Physician Assistant

## 2017-10-07 ENCOUNTER — Ambulatory Visit (INDEPENDENT_AMBULATORY_CARE_PROVIDER_SITE_OTHER): Payer: 59 | Admitting: Physician Assistant

## 2017-10-07 VITALS — BP 122/78 | HR 89 | Temp 98.6°F | Ht 65.0 in | Wt 156.4 lb

## 2017-10-07 DIAGNOSIS — Z113 Encounter for screening for infections with a predominantly sexual mode of transmission: Secondary | ICD-10-CM | POA: Diagnosis not present

## 2017-10-07 DIAGNOSIS — Z0000 Encounter for general adult medical examination without abnormal findings: Secondary | ICD-10-CM

## 2017-10-07 DIAGNOSIS — Z114 Encounter for screening for human immunodeficiency virus [HIV]: Secondary | ICD-10-CM

## 2017-10-07 DIAGNOSIS — D649 Anemia, unspecified: Secondary | ICD-10-CM | POA: Diagnosis not present

## 2017-10-07 DIAGNOSIS — N911 Secondary amenorrhea: Secondary | ICD-10-CM

## 2017-10-07 DIAGNOSIS — Z136 Encounter for screening for cardiovascular disorders: Secondary | ICD-10-CM

## 2017-10-07 DIAGNOSIS — Z13220 Encounter for screening for lipoid disorders: Secondary | ICD-10-CM

## 2017-10-07 NOTE — Patient Instructions (Signed)
It was great to see you!   

## 2017-10-07 NOTE — Progress Notes (Signed)
I acted as a Neurosurgeon for [employer], PA-C Corky Mull, LPN  Subjective:    Christy Molina is a 22 y.o. female and is here for a comprehensive physical exam.  HPI  There are no preventive care reminders to display for this patient.  Acute Concerns: Screening for STD -- not in a relationship, but would like testing today, no symptoms  Chronic Issues: Anemia -- history of this; denies lightheadedness, dizziness, heavy periods Secondary amenorrhea -- seen by Dr. Seymour Bars on 12/27/16; she has been on Depo-Provera in the past, last injection in January 2017 -- suspects chronic anovulation/PCOS. Pelvic ultrasound was ordered but not performed. She reports that she has follow-up Dr. Seymour Bars in a couple of months.   Health Maintenance: Immunizations -- UTD PAP -- done here 12/21/16 -- normal Diet -- drinks water, eats all food groups, can't drink soda --> causes her to break out Caffeine intake -- coffee -- 3 cups a day Sleep habits -- "sleep too much" --> gets at least 12 hours a night Exercise -- none scheduled Weight -- Weight: 156 lb 6.4 oz (70.9 kg)  Wt Readings from Last 3 Encounters:  10/07/17 156 lb 6.4 oz (70.9 kg)  09/30/17 159 lb 6.1 oz (72.3 kg)  12/27/16 140 lb (63.5 kg)  Mood -- no issues Last period -- Patient's last menstrual period was 07/25/2017. Period characteristics -- no longer regular Birth control -- currently not on any birth control  Depression screen PHQ 2/9 12/21/2016  Decreased Interest 0  Down, Depressed, Hopeless 0  PHQ - 2 Score 0    Other providers/specialists: Dentist -- [facility] -- [facility] -- Dr. Seymour Bars   PMHx, SurgHx, SocialHx, Medications, and Allergies were reviewed in the Visit Navigator and updated as appropriate.   Past Medical History:  Diagnosis Date  . Hematochezia      Past Surgical History:  Procedure Laterality Date  . WISDOM TOOTH EXTRACTION Bilateral 2014     Family History  Problem  Relation Age of Onset  . Diabetes Mother   . Breast cancer Neg Hx   . Colon cancer Neg Hx     Social History   Tobacco Use  . Smoking status: Never Smoker  . Smokeless tobacco: Never Used  Substance Use Topics  . Alcohol use: Yes    Comment: one glass of wine occassionally  . Drug use: No    Review of Systems:   Review of Systems  Constitutional: Negative.  Negative for chills, fever, malaise/fatigue and weight loss.  HENT: Negative.  Negative for hearing loss, sinus pain and sore throat.   Eyes: Negative.  Negative for blurred vision.  Respiratory: Negative.  Negative for cough and shortness of breath.   Cardiovascular: Negative.  Negative for chest pain, palpitations and leg swelling.  Gastrointestinal: Negative.  Negative for abdominal pain, constipation, diarrhea, heartburn, nausea and vomiting.  Genitourinary: Negative.  Negative for dysuria, frequency and urgency.  Musculoskeletal: Negative.  Negative for back pain, myalgias and neck pain.  Skin: Negative.  Negative for itching and rash.  Neurological: Negative.  Negative for dizziness, tingling, seizures, loss of consciousness and headaches.  Endo/Heme/Allergies: Negative.  Negative for polydipsia.  Psychiatric/Behavioral: Negative.  Negative for depression. The patient is not nervous/anxious.     Objective:   BP 122/78 (BP Location: Left Arm, Patient Position: Sitting, Cuff Size: Normal)   Pulse 89   Temp 98.6 F (37 C) (Oral)   Ht 5\' 5"  (1.651 m)  Wt 156 lb 6.4 oz (70.9 kg)   LMP 07/25/2017   SpO2 99%   BMI 26.03 kg/m   General Appearance:    Alert, cooperative, no distress, appears stated age  Head:    Normocephalic, without obvious abnormality, atraumatic  Eyes:    PERRL, conjunctiva/corneas clear, EOM's intact, fundi    benign, both eyes  Ears:    Normal TM's and external ear canals, both ears  Nose:   Nares normal, septum midline, mucosa normal, no drainage    or sinus tenderness  Throat:   Lips,  mucosa, and tongue normal; teeth and gums normal  Neck:   Supple, symmetrical, trachea midline, no adenopathy;    thyroid:  no enlargement/tenderness/nodules; no carotid   bruit or JVD  Back:     Symmetric, no curvature, ROM normal, no CVA tenderness  Lungs:     Clear to auscultation bilaterally, respirations unlabored  Chest Wall:    No tenderness or deformity   Heart:    Regular rate and rhythm, S1 and S2 normal, no murmur, rub   or gallop  Breast Exam:    Deferred  Abdomen:     Soft, non-tender, bowel sounds active all four quadrants,    no masses, no organomegaly  Genitalia:    Deferred  Rectal:    Deferred  Extremities:   Extremities normal, atraumatic, no cyanosis or edema  Pulses:   2+ and symmetric all extremities  Skin:   Skin color, texture, turgor normal, no rashes or lesions  Lymph nodes:   Cervical, supraclavicular, and axillary nodes normal  Neurologic:   CNII-XII intact, normal strength, sensation and reflexes    throughout    Assessment/Plan:   Christy Molina was seen today for annual exam.  Diagnoses and all orders for this visit:  Routine general medical examination at a health care facility Today patient counseled on age appropriate routine health concerns for screening and prevention, each reviewed and up to date or declined. Immunizations reviewed and up to date or declined. Labs ordered and reviewed. Risk factors for depression reviewed and negative. Hearing function and visual acuity are intact. ADLs screened and addressed as needed. Functional ability and level of safety reviewed and appropriate. Education, counseling and referrals performed based on assessed risks today. Patient provided with a copy of personalized plan for preventive services.  Encounter for lipid screening for cardiovascular disease -     Lipid panel  Screening for HIV (human immunodeficiency virus) -     HIV antibody  Screening examination for STD (sexually transmitted disease) She would  like screening today. Currently asymptomatic. -     HIV antibody -     RPR -     Urine cytology ancillary only; Future -     Urine cytology ancillary only  Anemia, unspecified type Asymptomatic, will re-check. -     Comprehensive metabolic panel -     CBC with Differential/Platelet  Secondary amenorrhea Per Dr. Seymour BarsLavoie, has scheduled follow-up soon.   Well Adult Exam: Labs ordered: Yes. Patient counseling was done. See below for items discussed. Discussed the patient's BMI. The BMI BMI is in the acceptable range Follow up as needed for acute illness.  Patient Counseling:   [x]     Nutrition: Stressed importance of moderation in sodium/caffeine intake, saturated fat and cholesterol, caloric balance, sufficient intake of fresh fruits, vegetables, fiber, calcium, iron, and 1 mg of folate supplement per day (for females capable of pregnancy).   [x]      Stressed the  importance of regular exercise.    [x]     Substance Abuse: Discussed cessation/primary prevention of tobacco, alcohol, or other drug use; driving or other dangerous activities under the influence; availability of treatment for abuse.    [x]      Injury prevention: Discussed safety belts, safety helmets, smoke detector, smoking near bedding or upholstery.    [x]      Sexuality: Discussed sexually transmitted diseases, partner selection, use of condoms, avoidance of unintended pregnancy  and contraceptive alternatives.    [x]     Dental health: Discussed importance of regular tooth brushing, flossing, and dental visits.   [x]      Health maintenance and immunizations reviewed. Please refer to Health maintenance section.   CMA or LPN served as scribe during this visit. History, Physical, and Plan performed by medical provider. Documentation and orders reviewed and attested to.  Jarold Motto, PA-C [facility]

## 2017-10-08 LAB — LIPID PANEL
CHOL/HDL RATIO: 3
Cholesterol: 168 mg/dL (ref 0–200)
HDL: 56.5 mg/dL (ref >39.00)
LDL Cholesterol: 99 mg/dL (ref 0–99)
NONHDL: 111.33
Triglycerides: 64 mg/dL (ref 0.0–149.0)
VLDL: 12.8 mg/dL (ref 0.0–40.0)

## 2017-10-08 LAB — COMPREHENSIVE METABOLIC PANEL
ALT: 6 U/L (ref 0–35)
AST: 13 U/L (ref 0–37)
Albumin: 4.7 g/dL (ref 3.5–5.2)
Alkaline Phosphatase: 42 U/L (ref 39–117)
BILIRUBIN TOTAL: 0.9 mg/dL (ref 0.2–1.2)
BUN: 10 mg/dL (ref 6–23)
CO2: 29 meq/L (ref 19–32)
Calcium: 10.3 mg/dL (ref 8.4–10.5)
Chloride: 104 mEq/L (ref 96–112)
Creatinine, Ser: 0.84 mg/dL (ref 0.40–1.20)
GFR: 108.71 mL/min (ref >60.00)
GLUCOSE: 82 mg/dL (ref 70–99)
Potassium: 4.1 mEq/L (ref 3.5–5.1)
SODIUM: 140 meq/L (ref 135–145)
Total Protein: 7.4 g/dL (ref 6.0–8.3)

## 2017-10-08 LAB — CBC WITH DIFFERENTIAL/PLATELET
BASOS PCT: 1.1 % (ref 0.0–3.0)
Basophils Absolute: 0.1 10*3/uL (ref 0.0–0.1)
EOS ABS: 0.1 10*3/uL (ref 0.0–0.7)
Eosinophils Relative: 1.3 % (ref 0.0–5.0)
HEMATOCRIT: 41.8 % (ref 36.0–46.0)
HEMOGLOBIN: 14.2 g/dL (ref 12.0–15.0)
Lymphocytes Relative: 36.1 % (ref 12.0–46.0)
Lymphs Abs: 2.2 10*3/uL (ref 0.7–4.0)
MCHC: 33.9 g/dL (ref 30.0–36.0)
MCV: 89.1 fl (ref 78.0–100.0)
MONO ABS: 0.4 10*3/uL (ref 0.1–1.0)
Monocytes Relative: 6.6 % (ref 3.0–12.0)
Neutro Abs: 3.3 10*3/uL (ref 1.4–7.7)
Neutrophils Relative %: 54.9 % (ref 43.0–77.0)
Platelets: 204 10*3/uL (ref 150.0–400.0)
RBC: 4.69 Mil/uL (ref 3.87–5.11)
RDW: 14.2 % (ref 11.5–15.5)
WBC: 6.1 10*3/uL (ref 4.0–10.5)

## 2017-10-08 LAB — HIV ANTIBODY (ROUTINE TESTING W REFLEX): HIV: NONREACTIVE

## 2017-10-08 LAB — RPR: RPR Ser Ql: NONREACTIVE

## 2017-10-09 LAB — URINE CYTOLOGY ANCILLARY ONLY
Chlamydia: NEGATIVE
NEISSERIA GONORRHEA: NEGATIVE
TRICH (WINDOWPATH): NEGATIVE

## 2019-03-10 ENCOUNTER — Ambulatory Visit (INDEPENDENT_AMBULATORY_CARE_PROVIDER_SITE_OTHER): Payer: Federal, State, Local not specified - PPO | Admitting: Physician Assistant

## 2019-03-10 ENCOUNTER — Other Ambulatory Visit (HOSPITAL_COMMUNITY)
Admission: RE | Admit: 2019-03-10 | Discharge: 2019-03-10 | Disposition: A | Discharge status: Home / Self Care | Payer: Federal, State, Local not specified - PPO | Source: Ambulatory Visit | Attending: Physician Assistant | Admitting: Physician Assistant

## 2019-03-10 ENCOUNTER — Encounter: Payer: Self-pay | Admitting: Physician Assistant

## 2019-03-10 ENCOUNTER — Other Ambulatory Visit: Payer: Self-pay

## 2019-03-10 VITALS — BP 110/80 | HR 76 | Temp 97.8°F | Ht 65.0 in | Wt 148.5 lb

## 2019-03-10 DIAGNOSIS — R61 Generalized hyperhidrosis: Secondary | ICD-10-CM

## 2019-03-10 DIAGNOSIS — N946 Dysmenorrhea, unspecified: Secondary | ICD-10-CM | POA: Diagnosis not present

## 2019-03-10 DIAGNOSIS — Z136 Encounter for screening for cardiovascular disorders: Secondary | ICD-10-CM | POA: Diagnosis not present

## 2019-03-10 DIAGNOSIS — Z13220 Encounter for screening for lipoid disorders: Secondary | ICD-10-CM | POA: Diagnosis not present

## 2019-03-10 DIAGNOSIS — Z113 Encounter for screening for infections with a predominantly sexual mode of transmission: Secondary | ICD-10-CM

## 2019-03-10 DIAGNOSIS — Z0001 Encounter for general adult medical examination with abnormal findings: Secondary | ICD-10-CM

## 2019-03-10 LAB — CBC WITH DIFFERENTIAL/PLATELET
Basophils Absolute: 0.1 10*3/uL (ref 0.0–0.1)
Basophils Relative: 0.8 % (ref 0.0–3.0)
Eosinophils Absolute: 0.1 10*3/uL (ref 0.0–0.7)
Eosinophils Relative: 1.3 % (ref 0.0–5.0)
HCT: 43.4 % (ref 36.0–46.0)
Hemoglobin: 14.4 g/dL (ref 12.0–15.0)
Lymphocytes Relative: 43.9 % (ref 12.0–46.0)
Lymphs Abs: 2.8 10*3/uL (ref 0.7–4.0)
MCHC: 33.3 g/dL (ref 30.0–36.0)
MCV: 89.5 fl (ref 78.0–100.0)
Monocytes Absolute: 0.5 10*3/uL (ref 0.1–1.0)
Monocytes Relative: 7.3 % (ref 3.0–12.0)
Neutro Abs: 3 10*3/uL (ref 1.4–7.7)
Neutrophils Relative %: 46.7 % (ref 43.0–77.0)
Platelets: 256 10*3/uL (ref 150.0–400.0)
RBC: 4.84 Mil/uL (ref 3.87–5.11)
RDW: 13.7 % (ref 11.5–15.5)
WBC: 6.5 10*3/uL (ref 4.0–10.5)

## 2019-03-10 LAB — COMPREHENSIVE METABOLIC PANEL
ALT: 4 U/L (ref 0–35)
AST: 12 U/L (ref 0–37)
Albumin: 4.8 g/dL (ref 3.5–5.2)
Alkaline Phosphatase: 40 U/L (ref 39–117)
BUN: 8 mg/dL (ref 6–23)
CO2: 28 mEq/L (ref 19–32)
Calcium: 10 mg/dL (ref 8.4–10.5)
Chloride: 103 mEq/L (ref 96–112)
Creatinine, Ser: 0.86 mg/dL (ref 0.40–1.20)
GFR: 98.3 mL/min (ref >60.00)
Glucose, Bld: 77 mg/dL (ref 70–99)
Potassium: 3.9 mEq/L (ref 3.5–5.1)
Sodium: 138 mEq/L (ref 135–145)
Total Bilirubin: 0.8 mg/dL (ref 0.2–1.2)
Total Protein: 7.4 g/dL (ref 6.0–8.3)

## 2019-03-10 LAB — LIPID PANEL
Cholesterol: 152 mg/dL (ref 0–200)
HDL: 63.2 mg/dL (ref >39.00)
LDL Cholesterol: 81 mg/dL (ref 0–99)
NonHDL: 89.14
Total CHOL/HDL Ratio: 2
Triglycerides: 40 mg/dL (ref 0.0–149.0)
VLDL: 8 mg/dL (ref 0.0–40.0)

## 2019-03-10 LAB — TSH: TSH: 3.71 u[IU]/mL (ref 0.35–4.50)

## 2019-03-10 NOTE — Patient Instructions (Signed)
It was great to see you!  Please go to the lab for blood work.   Our office will call you with your results unless you have chosen to receive results via MyChart.  If your blood work is normal we will follow-up each year for physicals and as scheduled for chronic medical problems.  If anything is abnormal we will treat accordingly and get you in for a follow-up.  Take care,  Christy Molina    Health Maintenance, Female Adopting a healthy lifestyle and getting preventive care are important in promoting health and wellness. Ask your health care provider about:  The right schedule for you to have regular tests and exams.  Things you can do on your own to prevent diseases and keep yourself healthy. What should I know about diet, weight, and exercise? Eat a healthy diet   Eat a diet that includes plenty of vegetables, fruits, low-fat dairy products, and lean protein.  Do not eat a lot of foods that are high in solid fats, added sugars, or sodium. Maintain a healthy weight Body mass index (BMI) is used to identify weight problems. It estimates body fat based on height and weight. Your health care provider can help determine your BMI and help you achieve or maintain a healthy weight. Get regular exercise Get regular exercise. This is one of the most important things you can do for your health. Most adults should:  Exercise for at least 150 minutes each week. The exercise should increase your heart rate and make you sweat (moderate-intensity exercise).  Do strengthening exercises at least twice a week. This is in addition to the moderate-intensity exercise.  Spend less time sitting. Even light physical activity can be beneficial. Watch cholesterol and blood lipids Have your blood tested for lipids and cholesterol at 24 years of age, then have this test every 5 years. Have your cholesterol levels checked more often if:  Your lipid or cholesterol levels are high.  You are older than 24  years of age.  You are at high risk for heart disease. What should I know about cancer screening? Depending on your health history and family history, you may need to have cancer screening at various ages. This may include screening for:  Breast cancer.  Cervical cancer.  Colorectal cancer.  Skin cancer.  Lung cancer. What should I know about heart disease, diabetes, and high blood pressure? Blood pressure and heart disease  High blood pressure causes heart disease and increases the risk of stroke. This is more likely to develop in people who have high blood pressure readings, are of African descent, or are overweight.  Have your blood pressure checked: ? Every 3-5 years if you are 18-39 years of age. ? Every year if you are 40 years old or older. Diabetes Have regular diabetes screenings. This checks your fasting blood sugar level. Have the screening done:  Once every three years after age 40 if you are at a normal weight and have a low risk for diabetes.  More often and at a younger age if you are overweight or have a high risk for diabetes. What should I know about preventing infection? Hepatitis B If you have a higher risk for hepatitis B, you should be screened for this virus. Talk with your health care provider to find out if you are at risk for hepatitis B infection. Hepatitis C Testing is recommended for:  Everyone born from 1945 through 1965.  Anyone with known risk factors for hepatitis C. Sexually   transmitted infections (STIs)  Get screened for STIs, including gonorrhea and chlamydia, if: ? You are sexually active and are younger than 24 years of age. ? You are older than 24 years of age and your health care provider tells you that you are at risk for this type of infection. ? Your sexual activity has changed since you were last screened, and you are at increased risk for chlamydia or gonorrhea. Ask your health care provider if you are at risk.  Ask your health  care provider about whether you are at high risk for HIV. Your health care provider may recommend a prescription medicine to help prevent HIV infection. If you choose to take medicine to prevent HIV, you should first get tested for HIV. You should then be tested every 3 months for as long as you are taking the medicine. Pregnancy  If you are about to stop having your period (premenopausal) and you may become pregnant, seek counseling before you get pregnant.  Take 400 to 800 micrograms (mcg) of folic acid every day if you become pregnant.  Ask for birth control (contraception) if you want to prevent pregnancy. Osteoporosis and menopause Osteoporosis is a disease in which the bones lose minerals and strength with aging. This can result in bone fractures. If you are 65 years old or older, or if you are at risk for osteoporosis and fractures, ask your health care provider if you should:  Be screened for bone loss.  Take a calcium or vitamin D supplement to lower your risk of fractures.  Be given hormone replacement therapy (HRT) to treat symptoms of menopause. Follow these instructions at home: Lifestyle  Do not use any products that contain nicotine or tobacco, such as cigarettes, e-cigarettes, and chewing tobacco. If you need help quitting, ask your health care provider.  Do not use street drugs.  Do not share needles.  Ask your health care provider for help if you need support or information about quitting drugs. Alcohol use  Do not drink alcohol if: ? Your health care provider tells you not to drink. ? You are pregnant, may be pregnant, or are planning to become pregnant.  If you drink alcohol: ? Limit how much you use to 0-1 drink a day. ? Limit intake if you are breastfeeding.  Be aware of how much alcohol is in your drink. In the [place] one drink equals one 12 oz bottle of beer (355 mL), one 5 oz glass of wine (148 mL), or one 1 oz glass of hard liquor (44 mL). General  instructions  Schedule regular health, dental, and eye exams.  Stay current with your vaccines.  Tell your health care provider if: ? You often feel depressed. ? You have ever been abused or do not feel safe at home. Summary  Adopting a healthy lifestyle and getting preventive care are important in promoting health and wellness.  Follow your health care provider's instructions about healthy diet, exercising, and getting tested or screened for diseases.  Follow your health care provider's instructions on monitoring your cholesterol and blood pressure. This information is not intended to replace advice given to you by your health care provider. Make sure you discuss any questions you have with your health care provider. Document Released: 01/22/2011 Document Revised: 07/02/2018 Document Reviewed: 07/02/2018 Elsevier Patient Education  2020 [employer]  

## 2019-03-10 NOTE — Progress Notes (Signed)
I acted as a Neurosurgeonscribe for Energy East CorporationSamantha Alletta Mattos, PA-C Corky Mullonna Orphanos, LPN  Subjective:    Christy Molina is a 23 y.o. female and is here for a comprehensive physical exam.  HPI  There are no preventive care reminders to display for this patient.  Acute Concerns: Sweating -- she notices that she has been sweating a lot at work. She thinks that it is due to anxiety about getting COVID-19. She is well protected and has a desk that is in complete isolation, but she still worries. She doesn't experience any issues with this at any other time of day. Denies unintentional weight loss or noticeable lymphadenopathy.  Chronic Issues: Dysmenorrhea -- continues to struggle with this but doesn't happen regularly enough for her to want to take any OCPs. Occurs every 3 or 4 months or so. She saw Dr. Seymour BarsLavoie in 2018 but hasn't followed up. She has been reading about endometriosis -- she is concerned that she may have this.  Health Maintenance: Immunizations -- UTD Colonoscopy -- N/A Mammogram -- N/A PAP -- UTD, done 12/21/2016 NILM Bone Density -- N/A Diet -- variable intake, eats all food groups Caffeine intake -- has to avoid during periods Sleep habits -- overall sleeps well Exercise -- none currently Current Weight -- Weight: 148 lb 8 oz (67.4 kg)  Weight History: Wt Readings from Last 10 Encounters:  03/10/19 148 lb 8 oz (67.4 kg)  10/07/17 156 lb 6.4 oz (70.9 kg)  09/30/17 159 lb 6.1 oz (72.3 kg)  12/27/16 140 lb (63.5 kg)  12/21/16 139 lb (63 kg)  07/29/15 128 lb 8 oz (58.3 kg)  02/07/15 125 lb 12.8 oz (57.1 kg) (46 %, Z= -0.10)*  06/28/14 124 lb (56.2 kg) (45 %, Z= -0.12)*  12/16/13 123 lb (55.8 kg) (46 %, Z= -0.11)*  12/16/13 123 lb 8 oz (56 kg) (47 %, Z= -0.08)*   * Growth percentiles are based on CDC (Girls, 2-20 Years) data.   Mood -- good No LMP recorded. (Menstrual status: Irregular Periods). Period characteristics -- irregular flow and pain Birth control -- none  Depression  screen PHQ 2/9 03/10/2019  Decreased Interest 0  Down, Depressed, Hopeless 0  PHQ - 2 Score 0     Other providers/specialists: Patient Care Team: Jarold MottoWorley, Tully Burgo, [place] as PCP - General (Physician Assistant)   PMHx, SurgHx, SocialHx, Medications, and Allergies were reviewed in the Visit Navigator and updated as appropriate.   Past Medical History:  Diagnosis Date  . Hematochezia      Past Surgical History:  Procedure Laterality Date  . WISDOM TOOTH EXTRACTION Bilateral 2014     Family History  Problem Relation Age of Onset  . Diabetes Mother   . Breast cancer Neg Hx   . Colon cancer Neg Hx     Social History   Tobacco Use  . Smoking status: Never Smoker  . Smokeless tobacco: Never Used  Substance Use Topics  . Alcohol use: Yes    Comment: one glass of wine occassionally  . Drug use: No    Review of Systems:   Review of Systems  Constitutional: Negative.  Negative for chills, fever, malaise/fatigue and weight loss.  HENT: Negative.  Negative for hearing loss, sinus pain and sore throat.   Eyes: Negative.  Negative for blurred vision.  Respiratory: Negative.  Negative for cough and shortness of breath.   Cardiovascular: Negative.  Negative for chest pain, palpitations and leg swelling.  Gastrointestinal: Negative.  Negative for abdominal pain, constipation,  diarrhea, heartburn, nausea and vomiting.  Genitourinary: Negative for dysuria, frequency and urgency.  Musculoskeletal: Negative for back pain, myalgias and neck pain.  Skin: Negative.  Negative for itching and rash.  Neurological: Negative.  Negative for dizziness, tingling, seizures, loss of consciousness and headaches.  Endo/Heme/Allergies: Negative.  Negative for polydipsia.  Psychiatric/Behavioral: Negative.  Negative for depression. The patient is not nervous/anxious.     Objective:   BP 110/80 (BP Location: Left Arm, Patient Position: Sitting, Cuff Size: Normal)   Pulse 76   Temp 97.8 F (36.6 C)  (Temporal)   Ht 5\' 5"  (1.651 m)   Wt 148 lb 8 oz (67.4 kg)   SpO2 99%   BMI 24.71 kg/m   General Appearance:    Alert, cooperative, no distress, appears stated age  Head:    Normocephalic, without obvious abnormality, atraumatic  Eyes:    PERRL, conjunctiva/corneas clear, EOM's intact, fundi    benign, both eyes  Ears:    Normal TM's and external ear canals, both ears  Nose:   Nares normal, septum midline, mucosa normal, no drainage    or sinus tenderness  Throat:   Lips, mucosa, and tongue normal; teeth and gums normal  Neck:   Supple, symmetrical, trachea midline, no adenopathy;    thyroid:  no enlargement/tenderness/nodules; no carotid   bruit or JVD  Back:     Symmetric, no curvature, ROM normal, no CVA tenderness  Lungs:     Clear to auscultation bilaterally, respirations unlabored  Chest Wall:    No tenderness or deformity   Heart:    Regular rate and rhythm, S1 and S2 normal, no murmur, rub   or gallop  Breast Exam:    Deferred  Abdomen:     Soft, non-tender, bowel sounds active all four quadrants,    no masses, no organomegaly  Genitalia:    Deferred  Rectal:    Deferred  Extremities:   Extremities normal, atraumatic, no cyanosis or edema  Pulses:   2+ and symmetric all extremities  Skin:   Skin color, texture, turgor normal, no rashes or lesions  Lymph nodes:   Cervical, supraclavicular, and axillary nodes normal  Neurologic:   CNII-XII intact, normal strength, sensation and reflexes    throughout    Assessment/Plan:   Christy Molina was seen today for annual exam.  Diagnoses and all orders for this visit:  Encounter for general adult medical examination with abnormal findings Today patient counseled on age appropriate routine health concerns for screening and prevention, each reviewed and up to date or declined. Immunizations reviewed and up to date or declined. Labs ordered and reviewed. Risk factors for depression reviewed and negative. Hearing function and visual  acuity are intact. ADLs screened and addressed as needed. Functional ability and level of safety reviewed and appropriate. Education, counseling and referrals performed based on assessed risks today. Patient provided with a copy of personalized plan for preventive services.  Screening examination for STD (sexually transmitted disease) -     Urine cytology ancillary only -     HIV Antibody (routine testing w rflx) -     RPR  Encounter for lipid screening for cardiovascular disease -     Lipid panel  Excessive sweating Suspect this is due to situational anxiety. Worsening precautions advised. Will check labs as well. -     CBC with Differential/Platelet -     Comprehensive metabolic panel -     TSH  Dysmenorrhea Recommended consideration of starting OCPs but she  doesn't think she can remember to take medication daily. Discussed other options, but she is resistant at this time. Recommended follow-up with ob-gyn for further discussion and eval.   Well Adult Exam: Labs ordered: Yes. Patient counseling was done. See below for items discussed. Discussed the patient's BMI. The BMI is in the acceptable range Follow up as needed for acute illness.  Patient Counseling:   [x]     Nutrition: Stressed importance of moderation in sodium/caffeine intake, saturated fat and cholesterol, caloric balance, sufficient intake of fresh fruits, vegetables, fiber, calcium, iron, and 1 mg of folate supplement per day (for females capable of pregnancy).   [x]      Stressed the importance of regular exercise.    [x]     Substance Abuse: Discussed cessation/primary prevention of tobacco, alcohol, or other drug use; driving or other dangerous activities under the influence; availability of treatment for abuse.    [x]      Injury prevention: Discussed safety belts, safety helmets, smoke detector, smoking near bedding or upholstery.    [x]      Sexuality: Discussed sexually transmitted diseases, partner selection,  use of condoms, avoidance of unintended pregnancy  and contraceptive alternatives.    [x]     Dental health: Discussed importance of regular tooth brushing, flossing, and dental visits.   [x]      Health maintenance and immunizations reviewed. Please refer to Health maintenance section.   CMA or LPN served as scribe during this visit. History, Physical, and Plan performed by medical provider. The above documentation has been reviewed and is accurate and complete.   Jarold MottoSamantha Gyan Cambre, PA-C [facility]

## 2019-03-11 LAB — URINE CYTOLOGY ANCILLARY ONLY
Chlamydia: NEGATIVE
Neisseria Gonorrhea: NEGATIVE
Trichomonas: NEGATIVE

## 2019-03-11 LAB — RPR: RPR Ser Ql: NONREACTIVE

## 2019-03-11 LAB — HIV ANTIBODY (ROUTINE TESTING W REFLEX): HIV 1&2 Ab, 4th Generation: NONREACTIVE

## 2019-12-09 ENCOUNTER — Encounter: Payer: Managed Care, Other (non HMO) | Admitting: Obstetrics & Gynecology

## 2019-12-09 DIAGNOSIS — Z0289 Encounter for other administrative examinations: Secondary | ICD-10-CM

## 2022-09-20 ENCOUNTER — Telehealth: Payer: Self-pay | Admitting: Physician Assistant

## 2022-09-20 NOTE — Telephone Encounter (Signed)
Patient states: -Been having constant  LUQ pain x 1 week, worsening as time goes on.   Patient has been transferred to triage.

## 2022-09-21 NOTE — Telephone Encounter (Signed)
Pt is not currently a pt of ours. Needs to set up a new pt appt & go to ED or UC.   Patient Name: Christy Molina Gender: Female DOB: May 01, 1996 Age: 27 Y 3 M 10 D Return Phone Number: [phone] (Primary) Address: City/ State/ Zip: [place]  [address] Client [name] Client [facility] Provider Morene Rankins, [place]- [place] Contact Type Call Who Is Calling Patient / Member / Family / Caregiver Call Type Triage / Clinical Relationship To Patient Self Return Phone Number [phone] (Primary) Chief Complaint SEVERE ABDOMINAL PAIN - Severe pain in abdomen Reason for Call Symptomatic / Request for [place] states she has abdominal pain for a week and it is severe now. She was transfered from the office. Translation No Nurse Assessment Nurse: Enid Derry, RN, Manuela Schwartz Date/Time Eilene Ghazi Time): 09/20/2022 3:09:27 PM Confirm and document reason for call. If symptomatic, describe symptoms. ---Caller states she has had abdominal pain for over a week and it is worsening; rates as 10/10. States she feels a sharp pain, in the upper left abdominal quarter. Currently on her cycle, but this is a different location. Has also had hot/cold chills. Does the patient have any new or worsening symptoms? ---Yes Will a triage be completed? ---Yes Related visit to physician within the last 2 weeks? ---No Does the PT have any chronic conditions? (i.e. diabetes, asthma, this includes High risk factors for pregnancy, etc.) ---No Is the patient pregnant or possibly pregnant? (Ask all females between the ages of 57-55) ---No Is this a behavioral health or substance abuse call? ---No Guidelines Guideline Title Affirmed Question Affirmed Notes Nurse Date/Time Eilene Ghazi Time) Abdominal Pain - Upper [1] SEVERE pain (e.g., excruciating) AND [2] present > 1 hour Lujean Rave  [id number] 3:13:10 PM Disp. Time Eilene Ghazi Time) Disposition Final User 09/20/2022 3:08:05 PM Send to Urgent Marina Goodell, [place] 09/20/2022 3:16:11 PM Go to ED Now Yes Enid Derry, RN, Manuela Schwartz Final Disposition 09/20/2022 3:16:11 PM Go to ED Now Yes Enid Derry, RN, Edwena Bunde Disagree/Comply Disagree Caller Understands Yes PreDisposition InappropriateToAsk Care Advice Given Per Guideline GO TO ED NOW: * You need to be seen in the Emergency Department. * Leave now. Drive carefully. NOTE TO TRIAGER - DRIVING: * Another adult should drive. * Bring a list of your current medicines when you go to the Emergency Department (ER). * Do not eat or drink anything for now. CARE ADVICE given per Abdominal Pain - Upper (Adult) guideline. Comments User: Marvell Fuller, RN Date/Time Eilene Ghazi Time): 09/20/2022 3:17:46 PM NOTE TO OFFICE: Patient states she wants to wait and be seen in the office; next week will be fine. Advised if she feels any worse, especially sweat on her face, difficulty breathing - proceed to ED ASAP. Referrals GO TO FACILITY REFUSED

## 2022-09-21 NOTE — Telephone Encounter (Signed)
Noted  

## 2022-09-28 ENCOUNTER — Emergency Department (HOSPITAL_BASED_OUTPATIENT_CLINIC_OR_DEPARTMENT_OTHER): Payer: Self-pay

## 2022-09-28 ENCOUNTER — Other Ambulatory Visit: Payer: Self-pay

## 2022-09-28 ENCOUNTER — Encounter (HOSPITAL_BASED_OUTPATIENT_CLINIC_OR_DEPARTMENT_OTHER): Payer: Self-pay

## 2022-09-28 ENCOUNTER — Emergency Department (HOSPITAL_BASED_OUTPATIENT_CLINIC_OR_DEPARTMENT_OTHER)
Admission: EM | Admit: 2022-09-28 | Discharge: 2022-09-28 | Disposition: A | Discharge status: Home / Self Care | Payer: Self-pay | Attending: Emergency Medicine | Admitting: Emergency Medicine

## 2022-09-28 DIAGNOSIS — R1032 Left lower quadrant pain: Secondary | ICD-10-CM | POA: Insufficient documentation

## 2022-09-28 DIAGNOSIS — R110 Nausea: Secondary | ICD-10-CM | POA: Insufficient documentation

## 2022-09-28 DIAGNOSIS — Z91010 Allergy to peanuts: Secondary | ICD-10-CM | POA: Insufficient documentation

## 2022-09-28 LAB — CBC
HCT: 39.2 % (ref 36.0–46.0)
Hemoglobin: 13.2 g/dL (ref 12.0–15.0)
MCH: 30.1 pg (ref 26.0–34.0)
MCHC: 33.7 g/dL (ref 30.0–36.0)
MCV: 89.5 fL (ref 80.0–100.0)
Platelets: 307 10*3/uL (ref 150–400)
RBC: 4.38 MIL/uL (ref 3.87–5.11)
RDW: 13.1 % (ref 11.5–15.5)
WBC: 8.9 10*3/uL (ref 4.0–10.5)
nRBC: 0 % (ref 0.0–0.2)

## 2022-09-28 LAB — URINALYSIS, ROUTINE W REFLEX MICROSCOPIC
Bilirubin Urine: NEGATIVE
Glucose, UA: NEGATIVE mg/dL
Hgb urine dipstick: NEGATIVE
Ketones, ur: NEGATIVE mg/dL
Leukocytes,Ua: NEGATIVE
Nitrite: NEGATIVE
Protein, ur: NEGATIVE mg/dL
Specific Gravity, Urine: 1.022 (ref 1.005–1.030)
pH: 5.5 (ref 5.0–8.0)

## 2022-09-28 LAB — COMPREHENSIVE METABOLIC PANEL
ALT: 5 U/L (ref 0–44)
AST: 14 U/L — ABNORMAL LOW (ref 15–41)
Albumin: 4.8 g/dL (ref 3.5–5.0)
Alkaline Phosphatase: 32 U/L — ABNORMAL LOW (ref 38–126)
Anion gap: 11 (ref 5–15)
BUN: 8 mg/dL (ref 6–20)
CO2: 23 mmol/L (ref 22–32)
Calcium: 10.1 mg/dL (ref 8.9–10.3)
Chloride: 104 mmol/L (ref 98–111)
Creatinine, Ser: 0.82 mg/dL (ref 0.44–1.00)
GFR, Estimated: >60 mL/min (ref >60)
Glucose, Bld: 93 mg/dL (ref 70–99)
Potassium: 3.6 mmol/L (ref 3.5–5.1)
Sodium: 138 mmol/L (ref 135–145)
Total Bilirubin: 0.9 mg/dL (ref 0.3–1.2)
Total Protein: 7.6 g/dL (ref 6.5–8.1)

## 2022-09-28 LAB — LIPASE, BLOOD: Lipase: 22 U/L (ref 11–51)

## 2022-09-28 LAB — PREGNANCY, URINE: Preg Test, Ur: NEGATIVE

## 2022-09-28 MED ORDER — ONDANSETRON HCL 4 MG/2ML IJ SOLN
4.0000 mg | Freq: Once | INTRAMUSCULAR | Status: AC
  Administered 2022-09-28: 4 mg via INTRAVENOUS
  Filled 2022-09-28: qty 2

## 2022-09-28 MED ORDER — IOHEXOL 300 MG/ML  SOLN
100.0000 mL | Freq: Once | INTRAMUSCULAR | Status: AC | PRN
  Administered 2022-09-28: 80 mL via INTRAVENOUS

## 2022-09-28 NOTE — Discharge Instructions (Addendum)
Workup for the abdominal pain without any acute findings.  Labs CT scan all normal.  Would recommend following back up with your primary care doctor.  And consideration for make an appointment for gastroenterology for consideration for colonoscopy.

## 2022-09-28 NOTE — ED Notes (Signed)
RN reviewed discharge instructions with pt. Pt verbalized understanding and had no further questions. VSS upon discharge.  

## 2022-09-28 NOTE — ED Triage Notes (Signed)
Patient here POV from Home.  Endorses Left Lower ABD pain that began a few weeks ago. Worsened since it began. Some Nausea. No emesis. Diarrhea initially that has subsided.   NAD noted during Triage. A&Ox4. GCS 15. Ambulatory.

## 2022-09-28 NOTE — ED Provider Notes (Addendum)
[facility] Provider Note   CSN: [id number] Arrival date & time: 09/28/22  1907     History  Chief Complaint  Patient presents with   Abdominal Pain    Christy Molina is a 27 y.o. female.  Patient with a complaint of left-sided abdominal pain mostly left lower quadrant been going on for a month.  Associated with nausea for 3 weeks.  No vomiting early on did have some diarrhea but that resolved.  No vaginal bleeding no vaginal discharge.  No dysuria.  Has not had pain like this before.  Past medical history significant for hematochezia.  Patient does not use tobacco products.       Home Medications Prior to Admission medications   Medication Sig Start Date End Date Taking? Authorizing Provider  cetirizine (ZYRTEC) 10 MG tablet Take 10 mg by mouth daily.    [provider]      Allergies    Food and Peanut-containing drug products    Review of Systems   Review of Systems  Constitutional:  Negative for chills and fever.  HENT:  Negative for ear pain and sore throat.   Eyes:  Negative for pain and visual disturbance.  Respiratory:  Negative for cough and shortness of breath.   Cardiovascular:  Negative for chest pain and palpitations.  Gastrointestinal:  Positive for abdominal pain, diarrhea and nausea. Negative for vomiting.  Genitourinary:  Negative for dysuria, flank pain, hematuria, vaginal bleeding and vaginal discharge.  Musculoskeletal:  Negative for arthralgias and back pain.  Skin:  Negative for color change and rash.  Neurological:  Negative for seizures and syncope.  All other systems reviewed and are negative.   Physical Exam Updated Vital Signs BP 133/69 (BP Location: Right Arm)   Pulse 80   Temp 98.4 F (36.9 C)   Resp 18   Ht 1.676 m ('5\' 6"'$ )   Wt 68 kg   SpO2 100%   BMI 24.21 kg/m  Physical Exam Vitals and nursing note reviewed.  Constitutional:      General: She is not in acute distress.     Appearance: Normal appearance. She is well-developed.  HENT:     Head: Normocephalic and atraumatic.  Eyes:     Conjunctiva/sclera: Conjunctivae normal.  Cardiovascular:     Rate and Rhythm: Normal rate and regular rhythm.     Heart sounds: No murmur heard. Pulmonary:     Effort: Pulmonary effort is normal. No respiratory distress.     Breath sounds: Normal breath sounds.  Abdominal:     General: There is no distension.     Palpations: Abdomen is soft.     Tenderness: There is no abdominal tenderness. There is no guarding.  Musculoskeletal:        General: No swelling.     Cervical back: Neck supple.  Skin:    General: Skin is warm and dry.     Capillary Refill: Capillary refill takes less than 2 seconds.  Neurological:     General: No focal deficit present.     Mental Status: She is alert and oriented to person, place, and time.  Psychiatric:        Mood and Affect: Mood normal.     ED Results / Procedures / Treatments   Labs (all labs ordered are listed, but only abnormal results are displayed) Labs Reviewed  CBC  URINALYSIS, ROUTINE W REFLEX MICROSCOPIC  PREGNANCY, URINE  LIPASE, BLOOD  COMPREHENSIVE METABOLIC PANEL  EKG None  Radiology No results found.  Procedures Procedures    Medications Ordered in ED Medications  ondansetron (ZOFRAN) injection 4 mg (has no administration in time range)    ED Course/ Medical Decision Making/ A&P                             Medical Decision Making Amount and/or Complexity of Data Reviewed Labs: ordered. Radiology: ordered.  Risk Prescription drug management.   Patient with a complaint of left-sided abdominal pain for a month.  Associated with nausea but no vomiting.  CBC is normal white count 8.9 hemoglobin 13.2.  Urinalysis negative for urinary tract infection.  Pregnancy test negative.  Complete metabolic panel and lipase are pending.  Will go and proceed and do CT abdomen and pelvis with contrast for  further evaluation.  Will give Zofran for the nausea patient did not want any pain medicine.  Disposition as per CT scan abdomen and pelvis.  CT scan abdomen and pelvis without any acute findings.  Would recommend follow-up with primary care provider.  May need evaluation by gastroenterology.   Final Clinical Impression(s) / ED Diagnoses Final diagnoses:  Left lower quadrant abdominal pain    Rx / DC Orders ED Discharge Orders     None         Fredia Sorrow, MD 09/28/22 [place], MD 09/28/22 [id number]    Fredia Sorrow, MD 09/28/22 2310

## 2023-06-04 NOTE — Progress Notes (Incomplete)
Christy Molina is a 27 y.o. female {ELNP/FU:31256} History of Present Illness:   Health Maintenance Due  Topic Date Due   Hepatitis C Screening  Never done   Cervical Cancer Screening (Pap smear)  12/22/2019   COVID-19 Vaccine (1 - 2023-24 season) Never done   No chief complaint on file.  HPI ***  ***  ***  ***  ***  *** Past Medical History:  Diagnosis Date   Hematochezia     Social History   Tobacco Use   Smoking status: Never   Smokeless tobacco: Never  Vaping Use   Vaping status: Never Used  Substance Use Topics   Alcohol use: Yes    Comment: one glass of wine occassionally   Drug use: Yes    Types: Marijuana    Comment: Occ   Past Surgical History:  Procedure Laterality Date   WISDOM TOOTH EXTRACTION Bilateral 2014   Family History  Problem Relation Age of Onset   Diabetes Mother    Breast cancer Neg Hx    Colon cancer Neg Hx    Allergies  Allergen Reactions   Food     Citrus fruits   Peanut-Containing Drug Products    Current Medications:   Current Outpatient Medications:    cetirizine (ZYRTEC) 10 MG tablet, Take 10 mg by mouth daily., Disp: , Rfl:   Review of Systems:   ROS See pertinent positives and negatives as per the HPI.  Vitals:   There were no vitals filed for this visit.   There is no height or weight on file to calculate BMI.  Physical Exam:   Physical Exam  Assessment and Plan:   There are no diagnoses linked to this encounter.            I,Emily Lagle,acting as a Neurosurgeon for [employer], PA.,have documented all relevant documentation on the behalf of Jarold Motto, PA,as directed by  Jarold Motto, PA while in the presence of Jarold Motto, [place].  *** (refresh reminder)  I, Jarold Motto, PA, have reviewed all documentation for this visit. The documentation on 06/04/23 for the exam, diagnosis, procedures, and orders are all accurate and complete.  Jarold Motto, PA-C

## 2023-06-06 ENCOUNTER — Ambulatory Visit: Payer: Self-pay | Admitting: Physician Assistant
